# Patient Record
Sex: Male | Born: 1990 | Race: Black or African American | Hispanic: No | Marital: Single | Smoking: Current some day smoker
Health system: Southern US, Community
[De-identification: ages and names within clinical notes are randomized; demographics above are authoritative.]

## PROBLEM LIST (undated history)

## (undated) DIAGNOSIS — S62143A Displaced fracture of body of hamate [unciform] bone, unspecified wrist, initial encounter for closed fracture: Secondary | ICD-10-CM

## (undated) HISTORY — PX: NO PAST SURGERIES: SHX2092

---
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
## 2012-01-07 ENCOUNTER — Encounter (HOSPITAL_COMMUNITY): Payer: Self-pay | Admitting: Emergency Medicine

## 2012-01-07 ENCOUNTER — Emergency Department (INDEPENDENT_AMBULATORY_CARE_PROVIDER_SITE_OTHER)
Admission: EM | Admit: 2012-01-07 | Discharge: 2012-01-07 | Disposition: A | Discharge status: Home / Self Care | Payer: Self-pay | Source: Home / Self Care

## 2012-01-07 DIAGNOSIS — L259 Unspecified contact dermatitis, unspecified cause: Secondary | ICD-10-CM

## 2012-01-07 NOTE — ED Provider Notes (Signed)
Medical screening examination/treatment/procedure(s) were performed by non-physician practitioner and as supervising physician I was immediately available for consultation/collaboration.  Leslee Home, M.D.   Reuben Likes, MD 01/07/12 2056

## 2012-01-07 NOTE — ED Notes (Signed)
Has noticed skin peeling on both hands for 2 weeks, no pain, no itching, denies blisters/bumps.  Patient reports he noticed skin pealing and skin color is different.

## 2012-01-07 NOTE — ED Provider Notes (Signed)
History     CSN: [id number]  Arrival date & time 01/07/12  1310   None     Chief Complaint  Patient presents with  . Rash    (Consider location/radiation/quality/duration/timing/severity/associated sxs/prior treatment) HPI Comments: Pt used degreaser on wheels of car 3 weeks ago without gloves.  No other chemical exposure, contact with new or different substances/meds.  A week later, skin of hands began to slough off.   Patient is a 21 y.o. male presenting with rash. The history is provided by the patient.  Rash  This is a new problem. Episode onset: 2 weeks ago. The problem has been gradually improving. The problem is associated with chemical exposure. There has been no fever. The rash is present on the left hand and right hand. The pain is at a severity of 0/10. Pertinent negatives include no itching. He has tried nothing for the symptoms.    History reviewed. No pertinent past medical history.  History reviewed. No pertinent past surgical history.  History reviewed. No pertinent family history.  History  Substance Use Topics  . Smoking status: Never Smoker   . Smokeless tobacco: Not on file  . Alcohol Use: No      Review of Systems  Constitutional: Negative for fever and chills.  Musculoskeletal:       Denies hand or wrist pain or change in function  Skin: Positive for color change and rash. Negative for itching and wound.  Neurological: Negative for weakness and numbness.    Allergies  Review of patient's allergies indicates no known allergies.  Home Medications  No current outpatient prescriptions on file.  BP 132/82  Pulse 85  Temp 98.7 F (37.1 C) (Oral)  Resp 14  SpO2 100%  Physical Exam  Constitutional: He appears well-developed and well-nourished. No distress.  Musculoskeletal:       Right hand: Normal.       Left hand: Normal.  Skin: Skin is warm and dry.       B hands with skin sloughing, new paler skin appears normal, nontender, healthy.       ED Course  Procedures (including critical care time)  Labs Reviewed - No data to display No results found.   1. Contact dermatitis       MDM          Cathlyn Parsons, NP 01/07/12 2054

## 2012-01-07 NOTE — Discharge Instructions (Signed)
Use vaseline or a good lotion/moisturizer cream on your hands until the skin heals.  Avoid contact with chemicals or putting your hands in water for prolonged periods (like washing dishes).   Contact Dermatitis Contact dermatitis is a reaction to certain substances that touch the skin. Contact dermatitis can be either irritant contact dermatitis or allergic contact dermatitis. Irritant contact dermatitis does not require previous exposure to the substance for a reaction to occur.Allergic contact dermatitis only occurs if you have been exposed to the substance before. Upon a repeat exposure, your body reacts to the substance.  CAUSES  Many substances can cause contact dermatitis. Irritant dermatitis is most commonly caused by repeated exposure to mildly irritating substances, such as:  Makeup.   Soaps.   Detergents.   Bleaches.   Acids.   Metal salts, such as nickel.  Allergic contact dermatitis is most commonly caused by exposure to:  Poisonous plants.   Chemicals (deodorants, shampoos).   Jewelry.   Latex.   Neomycin in triple antibiotic cream.   Preservatives in products, including clothing.  SYMPTOMS  The area of skin that is exposed may develop:  Dryness or flaking.   Redness.   Cracks.   Itching.   Pain or a burning sensation.   Blisters.  With allergic contact dermatitis, there may also be swelling in areas such as the eyelids, mouth, or genitals.  DIAGNOSIS  Your caregiver can usually tell what the problem is by doing a physical exam. In cases where the cause is uncertain and an allergic contact dermatitis is suspected, a patch skin test may be performed to help determine the cause of your dermatitis. TREATMENT Treatment includes protecting the skin from further contact with the irritating substance by avoiding that substance if possible. Barrier creams, powders, and gloves may be helpful. Your caregiver may also recommend:  Steroid creams or ointments  applied 2 times daily. For best results, soak the rash area in cool water for 20 minutes. Then apply the medicine. Cover the area with a plastic wrap. You can store the steroid cream in the refrigerator for a "chilly" effect on your rash. That may decrease itching. Oral steroid medicines may be needed in more severe cases.   Antibiotics or antibacterial ointments if a skin infection is present.   Antihistamine lotion or an antihistamine taken by mouth to ease itching.   Lubricants to keep moisture in your skin.   Burow's solution to reduce redness and soreness or to dry a weeping rash. Mix one packet or tablet of solution in 2 cups cool water. Dip a clean washcloth in the mixture, wring it out a bit, and put it on the affected area. Leave the cloth in place for 30 minutes. Do this as often as possible throughout the day.   Taking several cornstarch or baking soda baths daily if the area is too large to cover with a washcloth.  Harsh chemicals, such as alkalis or acids, can cause skin damage that is like a burn. You should flush your skin for 15 to 20 minutes with cold water after such an exposure. You should also seek immediate medical care after exposure. Bandages (dressings), antibiotics, and pain medicine may be needed for severely irritated skin.  HOME CARE INSTRUCTIONS  Avoid the substance that caused your reaction.   Keep the area of skin that is affected away from hot water, soap, sunlight, chemicals, acidic substances, or anything else that would irritate your skin.   Do not scratch the rash. Scratching may  cause the rash to become infected.   You may take cool baths to help stop the itching.   Only take over-the-counter or prescription medicines as directed by your caregiver.   See your caregiver for follow-up care as directed to make sure your skin is healing properly.  SEEK MEDICAL CARE IF:   Your condition is not better after 3 days of treatment.   You seem to be getting  worse.   You see signs of infection such as swelling, tenderness, redness, soreness, or warmth in the affected area.   You have any problems related to your medicines.  Document Released: 06/23/2000 Document Revised: 06/15/2011 Document Reviewed: 11/29/2010 [facility] Patient Information 2012 [place], [place].

## 2014-04-29 ENCOUNTER — Encounter (HOSPITAL_BASED_OUTPATIENT_CLINIC_OR_DEPARTMENT_OTHER): Payer: Self-pay | Admitting: Emergency Medicine

## 2014-04-29 ENCOUNTER — Emergency Department (HOSPITAL_BASED_OUTPATIENT_CLINIC_OR_DEPARTMENT_OTHER)
Admission: EM | Admit: 2014-04-29 | Discharge: 2014-04-29 | Disposition: A | Discharge status: Home / Self Care | Payer: Self-pay | Attending: Emergency Medicine | Admitting: Emergency Medicine

## 2014-04-29 DIAGNOSIS — Z720 Tobacco use: Secondary | ICD-10-CM | POA: Insufficient documentation

## 2014-04-29 DIAGNOSIS — B349 Viral infection, unspecified: Secondary | ICD-10-CM | POA: Insufficient documentation

## 2014-04-29 DIAGNOSIS — M791 Myalgia, unspecified site: Secondary | ICD-10-CM

## 2014-04-29 LAB — RAPID STREP SCREEN (MED CTR MEBANE ONLY): Streptococcus, Group A Screen (Direct): NEGATIVE

## 2014-04-29 MED ORDER — KETOROLAC TROMETHAMINE 60 MG/2ML IM SOLN
60.0000 mg | Freq: Once | INTRAMUSCULAR | Status: AC
  Administered 2014-04-29: 60 mg via INTRAMUSCULAR
  Filled 2014-04-29: qty 2

## 2014-04-29 MED ORDER — IBUPROFEN 600 MG PO TABS: 600.0000 mg | ORAL_TABLET | Freq: Four times a day (QID) | ORAL | Status: DC | PRN

## 2014-04-29 NOTE — ED Provider Notes (Signed)
CSN: [id number]     Arrival date & time 04/29/14  0844 History   First MD Initiated Contact with Patient 04/29/14 [phone]     Chief Complaint  Patient presents with  . Cough     (Consider location/radiation/quality/duration/timing/severity/associated sxs/prior Treatment) Patient is a 23 y.o. male presenting with cough.  Cough Associated symptoms: chills, headaches and sore throat   Associated symptoms: no chest pain, no fever, no rash and no shortness of breath     This is a 23 year old male who presents with myalgias, cough, chills, and sore throat. Symptoms started 2 days ago. Has had sick contacts. Has been using over-the-counter medications at home without relief. He reports a nonproductive cough that is intermittent and not associated with chest pain or shortness of breath. He reports diffuse bodyaches and chills that are worse at night. He also reports sore throat. He denies any difficulty swallowing.  He is a current smoker.  History reviewed. No pertinent past medical history. History reviewed. No pertinent past surgical history. No family history on file. History  Substance Use Topics  . Smoking status: Current Some Day Smoker -- 0.50 packs/day for 3 years    Types: Cigarettes  . Smokeless tobacco: Never Used  . Alcohol Use: Yes     Comment: Pt stated that he drinks beer on occasion    Review of Systems  Constitutional: Positive for chills. Negative for fever.  HENT: Positive for sore throat.   Respiratory: Positive for cough. Negative for chest tightness and shortness of breath.   Cardiovascular: Negative.  Negative for chest pain.  Gastrointestinal: Negative.  Negative for nausea, vomiting, abdominal pain and diarrhea.  Genitourinary: Negative.  Negative for dysuria.  Musculoskeletal: Negative for back pain.  Skin: Negative for rash.  Neurological: Positive for headaches.  All other systems reviewed and are negative.     Allergies  Review of patient's allergies  indicates no known allergies.  Home Medications   Prior to Admission medications   Medication Sig Start Date End Date Taking? Authorizing Provider  ibuprofen (ADVIL,MOTRIN) 100 MG tablet Take 100 mg by mouth every 6 (six) hours as needed for fever.   Yes Historical Provider, MD  ibuprofen (ADVIL,MOTRIN) 600 MG tablet Take 1 tablet (600 mg total) by mouth every 6 (six) hours as needed. 04/29/14   Shon Batonourtney F Willem Klingensmith, MD   BP 125/64  Pulse 84  Temp(Src) 98.8 F (37.1 C) (Oral)  Resp 16  Ht 6' (1.829 m)  Wt 130 lb (58.968 kg)  BMI 17.63 kg/m2  SpO2 98% Physical Exam  Nursing note and vitals reviewed. Constitutional: He is oriented to person, place, and time. He appears well-developed and well-nourished. No distress.  HENT:  Head: Normocephalic and atraumatic.  Mouth/Throat: Oropharynx is clear and moist. No oropharyngeal exudate.  Mild erythema to the posterior oropharynx, uvula midline, no significant tonsillar swelling  Eyes: EOM are normal. Pupils are equal, round, and reactive to light.  Neck: Neck supple.  No evidence of meningismus  Cardiovascular: Normal rate, regular rhythm and normal heart sounds.   No murmur heard. Pulmonary/Chest: Effort normal and breath sounds normal. No respiratory distress. He has no wheezes.  Abdominal: Soft. Bowel sounds are normal. There is no tenderness. There is no rebound.  Musculoskeletal: He exhibits no edema.  Lymphadenopathy:    He has no cervical adenopathy.  Neurological: He is alert and oriented to person, place, and time.  Skin: Skin is warm and dry.  Psychiatric: He has a normal mood and affect.  ED Course  Procedures (including critical care time) Labs Review Labs Reviewed  RAPID STREP SCREEN    Imaging Review No results found.   EKG Interpretation None     Medications  ketorolac (TORADOL) injection 60 mg (60 mg Intramuscular Given 04/29/14 0921)    MDM   Final diagnoses:  Viral syndrome  Myalgia   Patient  presents with multiple complaints. Is nontoxic-appearing and exam is reassuring. Suspect viral syndrome. Strep screen sent and negative. No active wheezing on exam and lung sounds are clear. No indication at this time for x-ray. Discuss with patient supportive care including hydration, Tylenol, ibuprofen, and over-the-counter medications for symptom relief. Patient stated understanding.  After history, exam, and medical workup I feel the patient has been appropriately medically screened and is safe for discharge home. Pertinent diagnoses were discussed with the patient. Patient was given return precautions.      Shon Batonourtney F Andreal Vultaggio, MD 04/29/14 [phone]

## 2014-04-29 NOTE — Discharge Instructions (Signed)

## 2014-04-29 NOTE — ED Notes (Signed)
MD and this RN at the bedside 

## 2014-04-29 NOTE — ED Notes (Addendum)
Pt stated that he started coughing up Normon Pettijohn mucuous on Monday 2 days ago around 1800 and stated that he has had a headache and sore throat since then. Pt states that he has chest pain only when coughing. Pt stated that he has been weak and had bodyaches and chills since the onset of symptoms. Pt stated that he has taken ibuprofen and nyquil without relief of the cough and headache.

## 2014-05-01 LAB — CULTURE, GROUP A STREP

## 2018-08-20 ENCOUNTER — Encounter (HOSPITAL_COMMUNITY): Payer: Self-pay | Admitting: *Deleted

## 2018-08-20 ENCOUNTER — Other Ambulatory Visit: Payer: Self-pay

## 2018-08-20 ENCOUNTER — Emergency Department (HOSPITAL_COMMUNITY)
Admission: EM | Admit: 2018-08-20 | Discharge: 2018-08-20 | Disposition: A | Discharge status: Home / Self Care | Payer: Self-pay | Attending: Emergency Medicine | Admitting: Emergency Medicine

## 2018-08-20 DIAGNOSIS — F17210 Nicotine dependence, cigarettes, uncomplicated: Secondary | ICD-10-CM | POA: Insufficient documentation

## 2018-08-20 DIAGNOSIS — L0291 Cutaneous abscess, unspecified: Secondary | ICD-10-CM

## 2018-08-20 DIAGNOSIS — L0231 Cutaneous abscess of buttock: Secondary | ICD-10-CM | POA: Insufficient documentation

## 2018-08-20 MED ORDER — OXYCODONE-ACETAMINOPHEN 5-325 MG PO TABS: 1.0000 | ORAL_TABLET | Freq: Four times a day (QID) | ORAL | 0 refills | Status: DC | PRN

## 2018-08-20 MED ORDER — OXYCODONE-ACETAMINOPHEN 5-325 MG PO TABS
1.0000 | ORAL_TABLET | Freq: Once | ORAL | Status: AC
Start: 2018-08-20 — End: 2018-08-20
  Administered 2018-08-20: 1 via ORAL
  Filled 2018-08-20: qty 1

## 2018-08-20 MED ORDER — LIDOCAINE HCL 2 % IJ SOLN
20.0000 mL | Freq: Once | INTRAMUSCULAR | Status: AC
  Administered 2018-08-20: 400 mg
  Filled 2018-08-20: qty 20

## 2018-08-20 MED ORDER — SULFAMETHOXAZOLE-TRIMETHOPRIM 800-160 MG PO TABS: 1.0000 | ORAL_TABLET | Freq: Two times a day (BID) | ORAL | 0 refills | Status: AC

## 2018-08-20 NOTE — Discharge Instructions (Addendum)
Please read attached information. If you experience any new or worsening signs or symptoms please return to the emergency room for evaluation. Please follow-up with your primary care provider or specialist as discussed. Please use medication prescribed only as directed and discontinue taking if you have any concerning signs or symptoms.   °

## 2018-08-20 NOTE — ED Provider Notes (Signed)
[facility] EMERGENCY DEPARTMENT Provider Note   CSN: [id number] Arrival date & time: 08/20/18  3354   History   Chief Complaint No chief complaint on file.   HPI Nathaniel Jordan is a 28 y.o. male.  HPI    27 year old male presents today with complaints of boil.  Patient notes approximately 1.5 weeks ago he noticed a boil developing on his gluteus.  He notes this is slowly worsened.  Patient denies any fever, denies any history of the same.  Patient denies any pain with defecation, bloody or purulent stool.    History reviewed. No pertinent past medical history.  There are no active problems to display for this patient.   History reviewed. No pertinent surgical history.      Home Medications    Prior to Admission medications   Medication Sig Start Date End Date Taking? Authorizing Provider  ibuprofen (ADVIL,MOTRIN) 100 MG tablet Take 100 mg by mouth every 6 (six) hours as needed for fever.    [provider]  ibuprofen (ADVIL,MOTRIN) 600 MG tablet Take 1 tablet (600 mg total) by mouth every 6 (six) hours as needed. 04/29/14   Horton, Mayer Masker, MD  oxyCODONE-acetaminophen (PERCOCET/ROXICET) 5-325 MG tablet Take 1 tablet by mouth every 6 (six) hours as needed. 08/20/18   Kidada Ging, Tinnie Gens, PA-C  sulfamethoxazole-trimethoprim (BACTRIM DS,SEPTRA DS) 800-160 MG tablet Take 1 tablet by mouth 2 (two) times daily for 7 days. 08/20/18 08/27/18  Eyvonne Mechanic, PA-C    Family History No family history on file.  Social History Social History   Tobacco Use  . Smoking status: Current Some Day Smoker    Packs/day: 0.50    Years: 3.00    Pack years: 1.50    Types: Cigarettes  . Smokeless tobacco: Never Used  Substance Use Topics  . Alcohol use: Yes    Comment: Pt stated that he drinks beer on occasion  . Drug use: Yes    Types: Marijuana     Allergies   Patient has no known allergies.   Review of Systems Review of Systems  All other  systems reviewed and are negative.    Physical Exam Updated Vital Signs BP 112/66 (BP Location: Right Arm)   Pulse (!) 102   Temp 98.5 F (36.9 C) (Oral)   Resp 14   Ht 5\' 11"  (1.803 m)   Wt 63.5 kg   SpO2 99%   BMI 19.53 kg/m   Physical Exam Vitals signs and nursing note reviewed. Exam conducted with a chaperone present.  Constitutional:      Appearance: He is well-developed.  HENT:     Head: Normocephalic and atraumatic.  Eyes:     General: No scleral icterus.       Right eye: No discharge.        Left eye: No discharge.     Conjunctiva/sclera: Conjunctivae normal.     Pupils: Pupils are equal, round, and reactive to light.  Neck:     Musculoskeletal: Normal range of motion.     Vascular: No JVD.     Trachea: No tracheal deviation.  Pulmonary:     Effort: Pulmonary effort is normal.     Breath sounds: No stridor.  Genitourinary:      Comments: 2 cm area of induration with central fluctuance noted in the photo below-induration does not extend into the anus, internal rectal exam shows no fluctuance induration or tenderness Neurological:     Mental Status: He is alert and  oriented to person, place, and time.     Coordination: Coordination normal.  Psychiatric:        Behavior: Behavior normal.        Thought Content: Thought content normal.        Judgment: Judgment normal.      ED Treatments / Results  Labs (all labs ordered are listed, but only abnormal results are displayed) Labs Reviewed  AEROBIC/ANAEROBIC CULTURE (SURGICAL/DEEP WOUND)    EKG None  Radiology No results found.  Procedures .Marland Kitchen.Incision and Drainage Date/Time: 08/20/2018 10:15 AM Performed by: Eyvonne MechanicHedges, Karene Bracken, PA-C Authorized by: Eyvonne MechanicHedges, Khadim Lundberg, PA-C   Consent:    Consent obtained:  Verbal   Consent given by:  Patient   Risks discussed:  Bleeding, incomplete drainage, damage to other organs, pain and infection   Alternatives discussed:  No treatment, delayed treatment and  alternative treatment Location:    Type:  Abscess   Size:  2   Location: left buttock  Pre-procedure details:    Skin preparation:  Betadine Anesthesia (see MAR for exact dosages):    Anesthesia method:  Local infiltration   Local anesthetic:  Lidocaine 2% w/o epi Procedure type:    Complexity:  Simple Procedure details:    Needle aspiration: no     Incision types:  Single straight   Incision depth:  Dermal   Scalpel blade:  11   Wound management:  Probed and deloculated   Drainage:  Purulent   Drainage amount:  Moderate   Wound treatment:  Wound left open   Packing materials:  1/4 in gauze Post-procedure details:    Patient tolerance of procedure:  Tolerated well, no immediate complications   (including critical care time)  Medications Ordered in ED Medications  lidocaine (XYLOCAINE) 2 % (with pres) injection 400 mg (400 mg Infiltration Given by Other 08/20/18 0904)  oxyCODONE-acetaminophen (PERCOCET/ROXICET) 5-325 MG per tablet 1 tablet (1 tablet Oral Given 08/20/18 0807)     Initial Impression / Assessment and Plan / ED Course  I have reviewed the triage vital signs and the nursing notes.  Pertinent labs & imaging results that were available during my care of the patient were reviewed by me and considered in my medical decision making (see chart for details).     Labs:   Imaging:  Consults:  Therapeutics: Percocet, lidocaine  Discharge Meds: Bactrim, Percocet  Assessment/Plan: 28 year old male presents today with a gluteal abscess.  This does not extend into the rectum or anus.  The abscess was drained and packed without complication.  Patient placed on antibiotics.  He will follow-up in 2 days if symptoms are not improving and return immediately if they worsen.  He verbalized understanding and agreement to today's plan had no further questions or concerns at the time of discharge.   Final Clinical Impressions(s) / ED Diagnoses   Final diagnoses:  Abscess     ED Discharge Orders         Ordered    sulfamethoxazole-trimethoprim (BACTRIM DS,SEPTRA DS) 800-160 MG tablet  2 times daily     08/20/18 0858    oxyCODONE-acetaminophen (PERCOCET/ROXICET) 5-325 MG tablet  Every 6 hours PRN     08/20/18 0858           Eyvonne MechanicHedges, Davey Bergsma, PA-C 08/20/18 1016    Gwyneth SproutPlunkett, [place], MD 08/20/18 1558

## 2018-08-20 NOTE — ED Triage Notes (Signed)
Pt in c/o L buttocks boil onset x 1.5 wk, pt afebrile, A&O x4

## 2018-08-24 LAB — AEROBIC/ANAEROBIC CULTURE W GRAM STAIN (SURGICAL/DEEP WOUND)

## 2018-08-24 LAB — AEROBIC/ANAEROBIC CULTURE (SURGICAL/DEEP WOUND)

## 2018-08-25 ENCOUNTER — Telehealth: Payer: Self-pay | Admitting: Emergency Medicine

## 2018-08-25 NOTE — Telephone Encounter (Signed)
Post ED Visit - Positive Culture Follow-up: Successful Patient Follow-Up  Culture assessed and recommendations reviewed by:  []  Enzo Bi, Pharm.D. []  Celedonio Miyamoto, Pharm.D., BCPS AQ-ID []  Garvin Fila, Pharm.D., BCPS []  Georgina Pillion, Pharm.D., BCPS []  [place], [place].D., BCPS, AAHIVP []  Estella Husk, Pharm.D., BCPS, AAHIVP []  Lysle Pearl, PharmD, BCPS []  Phillips Climes, PharmD, BCPS []  Agapito Games, PharmD, BCPS []  Verlan Friends, PharmD Link Snuffer PharmD  Positive culture  []  Patient discharged without antimicrobial prescription and treatment is now indicated []  Organism is resistant to prescribed ED discharge antimicrobial []  Patient with positive blood cultures  Changes discussed with ED provider: symptom check, if symptomatic start Augmentin 875mg  po bid x 7 days  Attempting to contact patient   Berle Mull 08/25/2018, 2:26 PM

## 2018-08-25 NOTE — Progress Notes (Signed)
ED Antimicrobial Stewardship Positive Culture Follow Up   Nathaniel Jordan is an 28 y.o. male who presented to [facility] on 08/20/2018 with a chief complaint of No chief complaint on file.   Recent Results (from the past 720 hour(s))  Aerobic/Anaerobic Culture (surgical/deep wound)     Status: None   Collection Time: 08/20/18  9:11 AM  Result Value Ref Range Status   Specimen Description BUTTOCKS  Final   Special Requests ABSCESS  Final   Gram Stain   Final    FEW WBC PRESENT, PREDOMINANTLY PMN MODERATE GRAM POSITIVE COCCI FEW GRAM POSITIVE RODS FEW GRAM NEGATIVE RODS    Culture   Final    FEW ACTINOMYCES SPECIES FEW BIFIDOBACTERIUM SPECIES BETA LACTAMASE NEGATIVE Performed at [facility], [address] [place], [place] [address]    Report Status 08/24/2018 FINAL  Final    [x]  Treated with Bactrim, organism resistant to prescribed antimicrobial   New antibiotic prescription: PA-C requests symptom check and if packing has been removed. If patient remains symptomatic, treat with Augmentin 875mg  po BID for 7 days. If non-symptomatic after draining and packing, then no further treatment required.   ED Provider: Army Melia, PA-C   Fayne Norrie 08/25/2018, 10:46 AM Clinical Pharmacist Monday - Friday phone -  [phone] Saturday - Sunday phone - [phone]

## 2018-08-29 ENCOUNTER — Telehealth: Payer: Self-pay

## 2018-08-29 NOTE — Telephone Encounter (Signed)
Called for symptom check on abscess. States everything is fine no problems. No abx change  Per Army Melia [facility]

## 2021-03-14 ENCOUNTER — Emergency Department (HOSPITAL_BASED_OUTPATIENT_CLINIC_OR_DEPARTMENT_OTHER)
Admission: EM | Admit: 2021-03-14 | Discharge: 2021-03-14 | Disposition: A | Discharge status: Home / Self Care | Payer: 59 | Attending: Emergency Medicine | Admitting: Emergency Medicine

## 2021-03-14 ENCOUNTER — Emergency Department (HOSPITAL_BASED_OUTPATIENT_CLINIC_OR_DEPARTMENT_OTHER): Payer: 59

## 2021-03-14 ENCOUNTER — Other Ambulatory Visit: Payer: Self-pay

## 2021-03-14 ENCOUNTER — Other Ambulatory Visit (HOSPITAL_BASED_OUTPATIENT_CLINIC_OR_DEPARTMENT_OTHER): Payer: Self-pay | Admitting: Orthopaedic Surgery

## 2021-03-14 ENCOUNTER — Encounter (HOSPITAL_BASED_OUTPATIENT_CLINIC_OR_DEPARTMENT_OTHER): Payer: Self-pay

## 2021-03-14 DIAGNOSIS — S62144A Nondisplaced fracture of body of hamate [unciform] bone, right wrist, initial encounter for closed fracture: Secondary | ICD-10-CM | POA: Diagnosis not present

## 2021-03-14 DIAGNOSIS — F17210 Nicotine dependence, cigarettes, uncomplicated: Secondary | ICD-10-CM | POA: Diagnosis not present

## 2021-03-14 DIAGNOSIS — W228XXA Striking against or struck by other objects, initial encounter: Secondary | ICD-10-CM | POA: Insufficient documentation

## 2021-03-14 DIAGNOSIS — M79641 Pain in right hand: Secondary | ICD-10-CM | POA: Diagnosis not present

## 2021-03-14 DIAGNOSIS — S6991XA Unspecified injury of right wrist, hand and finger(s), initial encounter: Secondary | ICD-10-CM | POA: Diagnosis present

## 2021-03-14 NOTE — ED Triage Notes (Signed)
Pt arrives ambulatory to ED with c/o pain to right hand after punching a wall last night.

## 2021-03-14 NOTE — ED Provider Notes (Signed)
[facility] EMERGENCY DEPARTMENT Provider Note   CSN: [id number] Arrival date & time: 03/14/21  6063     History Chief Complaint  Patient presents with   Hand Injury    Nathaniel Jordan is a 30 y.o. male with no significant past medical history who reports hand pain after punching a wall last night with his right hand. Patient reports he felt some pain after the punch but it wasn't hurting too bad initially. When he woke up this morning, he had a lot of pain and had difficulty opening and closing fingers 2/2 pain. Patient has not taken any pain medication.   Hand Injury     History reviewed. No pertinent past medical history.  There are no problems to display for this patient.   History reviewed. No pertinent surgical history.     No family history on file.  Social History   Tobacco Use   Smoking status: Some Days    Packs/day: 0.50    Years: 3.00    Pack years: 1.50    Types: Cigarettes   Smokeless tobacco: Never  Vaping Use   Vaping Use: Every day  Substance Use Topics   Alcohol use: Yes    Comment: Pt stated that he drinks beer on occasion   Drug use: Not Currently    Types: Marijuana    Home Medications Prior to Admission medications   Medication Sig Start Date End Date Taking? Authorizing Provider  ibuprofen (ADVIL,MOTRIN) 100 MG tablet Take 100 mg by mouth every 6 (six) hours as needed for fever.    [provider]  ibuprofen (ADVIL,MOTRIN) 600 MG tablet Take 1 tablet (600 mg total) by mouth every 6 (six) hours as needed. 04/29/14   Horton, Mayer Masker, MD  oxyCODONE-acetaminophen (PERCOCET/ROXICET) 5-325 MG tablet Take 1 tablet by mouth every 6 (six) hours as needed. 08/20/18   Eyvonne Mechanic, PA-C    Allergies    Patient has no known allergies.  Review of Systems   Review of Systems  Musculoskeletal:        Hand pain  All other systems reviewed and are negative.  Physical Exam Updated Vital Signs BP 127/84 (BP Location:  Left Arm)   Pulse 88   Temp 98.7 F (37.1 C) (Oral)   Resp 16   Ht 5\' 11"  (1.803 m)   Wt 63.5 kg   SpO2 99%   BMI 19.53 kg/m   Physical Exam Vitals and nursing note reviewed.  Constitutional:      General: He is not in acute distress.    Appearance: Normal appearance.  HENT:     Head: Normocephalic and atraumatic.  Eyes:     General:        Right eye: No discharge.        Left eye: No discharge.  Cardiovascular:     Rate and Rhythm: Normal rate and regular rhythm.     Comments: Radial and ulnar pulses of right hand 2+ Pulmonary:     Effort: Pulmonary effort is normal. No respiratory distress.  Musculoskeletal:        General: No deformity.     Comments: No obvious deformity of the right hand. TTP on the dorsal aspect of the 5th metacarpal. No TTP on radial or ulnar styloid. No TTP in any of the phalanges. No anatomic snuffbox tenderness. Patient is able to open and close hand, oppose pinky to thumb with some pain. Strength diminished to 3/5 2/2 pain.  Skin:  General: Skin is warm and dry.     Capillary Refill: Capillary refill takes less than 2 seconds. Intact distal to injury Neurological:     Mental Status: He is alert and oriented to person, place, and time.     Comments: No sensory deficit of right hand or wrist.  Psychiatric:        Mood and Affect: Mood normal.        Behavior: Behavior normal.    ED Results / Procedures / Treatments   Labs (all labs ordered are listed, but only abnormal results are displayed) Labs Reviewed - No data to display  EKG None  Radiology DG Hand Complete Right  Result Date: 03/14/2021 CLINICAL DATA:  RIGHT hand pain after punching a wall last night EXAM: RIGHT HAND - COMPLETE 3+ VIEW COMPARISON:  None. FINDINGS: Osseous mineralization normal. Congenital lunatotriquetral fusion. Remaining joint spaces preserved. Longitudinal displaced fracture of the hamate, minimally distracted, extending into the fifth CMC joint and  triquetrohamate joint. No additional fracture, dislocation, or bone destruction. IMPRESSION: Longitudinal mildly displaced intra-articular hamate fracture. Electronically Signed   By: Ulyses Southward M.D.   On: 03/14/2021 10:14    Procedures Procedures   Medications Ordered in ED Medications - No data to display  ED Course  I have reviewed the triage vital signs and the nursing notes.  Pertinent labs & imaging results that were available during my care of the patient were reviewed by me and considered in my medical decision making (see chart for details).    MDM Rules/Calculators/A&P                         Radiograph of hand shows "longitudinal mildly displaced intra-articular hamate fracture".  Consulted hand surgery to discuss appropriate splinting, and follow-up.  Patient is neurovascularly intact, good capillary refill distal to site of injury, no rigidity of skin, warmth, excessive tenderness, do not favor any compartment syndrome.  Hand surgery suggests volar wrist splint, CT of hand, and follow up with Dr. Frazier Butt at patient's earliest convenience. Patient informed of plan. Return precautions given. Final Clinical Impression(s) / ED Diagnoses Final diagnoses:  Closed nondisplaced fracture of hamate bone of right wrist, unspecified portion of hamate, initial encounter    Rx / DC Orders ED Discharge Orders     None        Olene Floss, PA-C 03/14/21 1257    Virgina Norfolk, DO 03/14/21 1544

## 2021-03-14 NOTE — Discharge Instructions (Addendum)
As we discussed you have a hamate fracture in your right hand, which is one of the carpal bones and the base of your palm.  Please follow-up with the orthopedic doctor's contact information I have attached your chart today.  Do not get your splint wet you may shower with a trash bag over top of it.  Please use Tylenol or ibuprofen for pain.  You may use 600 mg ibuprofen every 6 hours or 1000 mg of Tylenol every 6 hours.  You may choose to alternate between the 2.  This would be most effective.  Not to exceed 4 g of Tylenol within 24 hours.  Not to exceed 3200 mg ibuprofen 24 hours.

## 2021-03-17 ENCOUNTER — Encounter: Payer: Self-pay | Admitting: Orthopedic Surgery

## 2021-03-17 ENCOUNTER — Other Ambulatory Visit: Payer: Self-pay

## 2021-03-17 ENCOUNTER — Ambulatory Visit (INDEPENDENT_AMBULATORY_CARE_PROVIDER_SITE_OTHER): Payer: 59 | Admitting: Orthopedic Surgery

## 2021-03-17 DIAGNOSIS — S62141A Displaced fracture of body of hamate [unciform] bone, right wrist, initial encounter for closed fracture: Secondary | ICD-10-CM | POA: Diagnosis not present

## 2021-03-17 NOTE — H&P (View-Only) (Signed)
Office Visit Note   Patient: Nathaniel Jordan           Date of Birth: 25-Jun-1991           MRN: [id number] Visit Date: 03/17/2021              Requested by: No referring provider defined for this encounter. PCP: Patient, No Pcp Per (Inactive)   Assessment & Plan: Visit Diagnoses:  1. Closed displaced fracture of body of hamate of right wrist, initial encounter     Plan: We discussed the diagnosis, prognosis, and both conservative and operative treatment options for his displaced right hamate body fracture.  After our discussion, the patient has elected to proceed with ORIF hamate with pinning of 4th and 5th CMC joints.  We reviewed the benefits of surgery and the potential risks including, but not limited to, persistent symptoms, infection, damage to nearby nerves and blood vessels, delayed wound healing, nonunion, malunion.    All patient concerns and questions were addressed.  A surgical date will be confirmed with the patient.    Follow-Up Instructions: No follow-ups on file.   Orders:  No orders of the defined types were placed in this encounter.  No orders of the defined types were placed in this encounter.     Procedures: No procedures performed   Clinical Data: No additional findings.   Subjective: Chief Complaint  Patient presents with   Right Hand - Fracture    DOI 03/13/2021    This is a 30 year old right-hand-dominant male who presents as an ER referral with dorsal ulnar right hand pain.  He punched a wall on Monday and was seen in the ER.  A CT was performed which showed a displaced fracture of the hamate body with some subluxation of the fourth and fifth CMC joints.  He was placed into a splint.  He continues to have pain with range of motion of his fingers.  His pain is 5 or 6 out of 10 and localized to the dorsal ulnar aspect of the hand around the hamate.  He is never injured this hand before.  He denies any numbness or paresthesias.  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   Review of Systems  Constitutional: Negative.   Respiratory: Negative.    Cardiovascular: Negative.   Musculoskeletal:  Positive for joint swelling.  Skin: Negative.   Neurological: Negative.   Psychiatric/Behavioral: Negative.      Objective: Vital Signs: Ht 5\' 11"  (1.803 m)   Wt 150 lb (68 kg)   BMI 20.92 kg/m   Physical Exam Constitutional:      Appearance: Normal appearance.  Cardiovascular:     Rate and Rhythm: Normal rate.     Pulses: Normal pulses.  Pulmonary:     Effort: Pulmonary effort is normal.  Skin:    General: Skin is warm and dry.     Capillary Refill: Capillary refill takes less than 2 seconds.  Neurological:     General: No focal deficit present.     Mental Status: He is alert.  Psychiatric:        Mood and Affect: Mood normal.    Right Hand Exam   Tenderness  Right hand tenderness location: TTP at dorsal aspect of hand over hamate, moderate swelling of the dorsum of his hand.  No wounds.  Range of Motion  Wrist  Right wrist extension: Limited by pain, swelling.  Right wrist flexion: Limited  by pain, swelling.   Muscle Strength  Right wrist normal muscle strength: Limited by pain, swelling.  Other  Erythema: absent Sensation: normal Pulse: present     Specialty Comments:  No specialty comments available.  Imaging: X-ray of the right hand taken in the ER on Monday reviewed interpreted by me.  Imaging demonstrates a difficult to characterize fracture of the hamate body.  The fifth CMC joint appears to be subluxed in these images.  CT scan of the right hand from his ER visit on Monday also reviewed by me.  The study demonstrates a coronal fracture through the hamate body with dorsal displacement of the dorsal fragment and mild subluxation of the fourth and fifth CMC joints.  There is a comminuted fracture involving the volar articular surface of the fourth  metacarpal.   PMFS History: Patient Active Problem List   Diagnosis Date Noted   Closed displaced fracture of body of right hamate bone 03/17/2021   No past medical history on file.  No family history on file.  No past surgical history on file. Social History   Occupational History   Not on file  Tobacco Use   Smoking status: Some Days    Packs/day: 0.50    Years: 3.00    Pack years: 1.50    Types: Cigarettes   Smokeless tobacco: Never  Vaping Use   Vaping Use: Every day  Substance and Sexual Activity   Alcohol use: Yes    Comment: Pt stated that he drinks beer on occasion   Drug use: Not Currently    Types: Marijuana   Sexual activity: Yes        

## 2021-03-17 NOTE — Progress Notes (Signed)
Office Visit Note   Patient: Nathaniel Jordan           Date of Birth: 25-Jun-1991           MRN: [id number] Visit Date: 03/17/2021              Requested by: No referring provider defined for this encounter. PCP: Patient, No Pcp Per (Inactive)   Assessment & Plan: Visit Diagnoses:  1. Closed displaced fracture of body of hamate of right wrist, initial encounter     Plan: We discussed the diagnosis, prognosis, and both conservative and operative treatment options for his displaced right hamate body fracture.  After our discussion, the patient has elected to proceed with ORIF hamate with pinning of 4th and 5th CMC joints.  We reviewed the benefits of surgery and the potential risks including, but not limited to, persistent symptoms, infection, damage to nearby nerves and blood vessels, delayed wound healing, nonunion, malunion.    All patient concerns and questions were addressed.  A surgical date will be confirmed with the patient.    Follow-Up Instructions: No follow-ups on file.   Orders:  No orders of the defined types were placed in this encounter.  No orders of the defined types were placed in this encounter.     Procedures: No procedures performed   Clinical Data: No additional findings.   Subjective: Chief Complaint  Patient presents with   Right Hand - Fracture    DOI 03/13/2021    This is a 30 year old right-hand-dominant male who presents as an ER referral with dorsal ulnar right hand pain.  He punched a wall on Monday and was seen in the ER.  A CT was performed which showed a displaced fracture of the hamate body with some subluxation of the fourth and fifth CMC joints.  He was placed into a splint.  He continues to have pain with range of motion of his fingers.  His pain is 5 or 6 out of 10 and localized to the dorsal ulnar aspect of the hand around the hamate.  He is never injured this hand before.  He denies any numbness or paresthesias.  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   Review of Systems  Constitutional: Negative.   Respiratory: Negative.    Cardiovascular: Negative.   Musculoskeletal:  Positive for joint swelling.  Skin: Negative.   Neurological: Negative.   Psychiatric/Behavioral: Negative.      Objective: Vital Signs: Ht 5\' 11"  (1.803 m)   Wt 150 lb (68 kg)   BMI 20.92 kg/m   Physical Exam Constitutional:      Appearance: Normal appearance.  Cardiovascular:     Rate and Rhythm: Normal rate.     Pulses: Normal pulses.  Pulmonary:     Effort: Pulmonary effort is normal.  Skin:    General: Skin is warm and dry.     Capillary Refill: Capillary refill takes less than 2 seconds.  Neurological:     General: No focal deficit present.     Mental Status: He is alert.  Psychiatric:        Mood and Affect: Mood normal.    Right Hand Exam   Tenderness  Right hand tenderness location: TTP at dorsal aspect of hand over hamate, moderate swelling of the dorsum of his hand.  No wounds.  Range of Motion  Wrist  Right wrist extension: Limited by pain, swelling.  Right wrist flexion: Limited  by pain, swelling.   Muscle Strength  Right wrist normal muscle strength: Limited by pain, swelling.  Other  Erythema: absent Sensation: normal Pulse: present     Specialty Comments:  No specialty comments available.  Imaging: X-ray of the right hand taken in the ER on Monday reviewed interpreted by me.  Imaging demonstrates a difficult to characterize fracture of the hamate body.  The fifth CMC joint appears to be subluxed in these images.  CT scan of the right hand from his ER visit on Monday also reviewed by me.  The study demonstrates a coronal fracture through the hamate body with dorsal displacement of the dorsal fragment and mild subluxation of the fourth and fifth CMC joints.  There is a comminuted fracture involving the volar articular surface of the fourth  metacarpal.   PMFS History: Patient Active Problem List   Diagnosis Date Noted   Closed displaced fracture of body of right hamate bone 03/17/2021   No past medical history on file.  No family history on file.  No past surgical history on file. Social History   Occupational History   Not on file  Tobacco Use   Smoking status: Some Days    Packs/day: 0.50    Years: 3.00    Pack years: 1.50    Types: Cigarettes   Smokeless tobacco: Never  Vaping Use   Vaping Use: Every day  Substance and Sexual Activity   Alcohol use: Yes    Comment: Pt stated that he drinks beer on occasion   Drug use: Not Currently    Types: Marijuana   Sexual activity: Yes

## 2021-03-22 ENCOUNTER — Encounter (HOSPITAL_BASED_OUTPATIENT_CLINIC_OR_DEPARTMENT_OTHER): Payer: Self-pay | Admitting: Orthopedic Surgery

## 2021-03-22 ENCOUNTER — Other Ambulatory Visit: Payer: Self-pay

## 2021-03-23 ENCOUNTER — Other Ambulatory Visit: Payer: Self-pay

## 2021-03-23 ENCOUNTER — Encounter (HOSPITAL_BASED_OUTPATIENT_CLINIC_OR_DEPARTMENT_OTHER): Payer: Self-pay | Admitting: Orthopedic Surgery

## 2021-03-23 ENCOUNTER — Ambulatory Visit (HOSPITAL_BASED_OUTPATIENT_CLINIC_OR_DEPARTMENT_OTHER): Payer: 59 | Admitting: Anesthesiology

## 2021-03-23 ENCOUNTER — Ambulatory Visit (HOSPITAL_BASED_OUTPATIENT_CLINIC_OR_DEPARTMENT_OTHER): Payer: 59

## 2021-03-23 ENCOUNTER — Encounter (HOSPITAL_BASED_OUTPATIENT_CLINIC_OR_DEPARTMENT_OTHER)
Admission: RE | Disposition: A | Discharge status: Home / Self Care | Payer: Self-pay | Source: Home / Self Care | Attending: Orthopedic Surgery

## 2021-03-23 ENCOUNTER — Ambulatory Visit (HOSPITAL_BASED_OUTPATIENT_CLINIC_OR_DEPARTMENT_OTHER)
Admission: RE | Admit: 2021-03-23 | Discharge: 2021-03-23 | Disposition: A | Discharge status: Home / Self Care | Payer: 59 | Attending: Orthopedic Surgery | Admitting: Orthopedic Surgery

## 2021-03-23 DIAGNOSIS — S63054A Dislocation of other carpometacarpal joint of right hand, initial encounter: Secondary | ICD-10-CM | POA: Diagnosis not present

## 2021-03-23 DIAGNOSIS — F17210 Nicotine dependence, cigarettes, uncomplicated: Secondary | ICD-10-CM | POA: Insufficient documentation

## 2021-03-23 DIAGNOSIS — X58XXXA Exposure to other specified factors, initial encounter: Secondary | ICD-10-CM | POA: Diagnosis not present

## 2021-03-23 DIAGNOSIS — S62141A Displaced fracture of body of hamate [unciform] bone, right wrist, initial encounter for closed fracture: Secondary | ICD-10-CM | POA: Diagnosis not present

## 2021-03-23 HISTORY — DX: Displaced fracture of body of hamate (unciform) bone, unspecified wrist, initial encounter for closed fracture: S62.143A

## 2021-03-23 HISTORY — PX: CLOSED REDUCTION METACARPAL WITH PERCUTANEOUS PINNING: SHX5613

## 2021-03-23 SURGERY — CLOSED REDUCTION, FRACTURE, METACARPAL BONE, WITH PERCUTANEOUS PINNING
Anesthesia: General | Site: Hand | Laterality: Right

## 2021-03-23 MED ORDER — PROPOFOL 10 MG/ML IV BOLUS
INTRAVENOUS | Status: AC
  Filled 2021-03-23: qty 20

## 2021-03-23 MED ORDER — ONDANSETRON HCL 4 MG/2ML IJ SOLN
INTRAMUSCULAR | Status: AC
  Filled 2021-03-23: qty 2

## 2021-03-23 MED ORDER — CEFAZOLIN SODIUM-DEXTROSE 2-4 GM/100ML-% IV SOLN
INTRAVENOUS | Status: AC
  Filled 2021-03-23: qty 100

## 2021-03-23 MED ORDER — LIDOCAINE HCL (CARDIAC) PF 100 MG/5ML IV SOSY
PREFILLED_SYRINGE | INTRAVENOUS | Status: DC | PRN
  Administered 2021-03-23: 60 mg via INTRATRACHEAL

## 2021-03-23 MED ORDER — FENTANYL CITRATE (PF) 100 MCG/2ML IJ SOLN
100.0000 ug | Freq: Once | INTRAMUSCULAR | Status: AC
  Administered 2021-03-23: 100 ug via INTRAVENOUS

## 2021-03-23 MED ORDER — MIDAZOLAM HCL 2 MG/2ML IJ SOLN
INTRAMUSCULAR | Status: AC
  Filled 2021-03-23: qty 2

## 2021-03-23 MED ORDER — OXYCODONE HCL 5 MG/5ML PO SOLN: 5.0000 mg | Freq: Once | ORAL | Status: DC | PRN

## 2021-03-23 MED ORDER — FENTANYL CITRATE (PF) 100 MCG/2ML IJ SOLN
INTRAMUSCULAR | Status: AC
  Filled 2021-03-23: qty 2

## 2021-03-23 MED ORDER — DEXAMETHASONE SODIUM PHOSPHATE 10 MG/ML IJ SOLN
INTRAMUSCULAR | Status: DC | PRN
  Administered 2021-03-23: 10 mg via INTRAVENOUS

## 2021-03-23 MED ORDER — DEXAMETHASONE SODIUM PHOSPHATE 10 MG/ML IJ SOLN
INTRAMUSCULAR | Status: AC
  Filled 2021-03-23: qty 1

## 2021-03-23 MED ORDER — LIDOCAINE 2% (20 MG/ML) 5 ML SYRINGE
INTRAMUSCULAR | Status: AC
  Filled 2021-03-23: qty 5

## 2021-03-23 MED ORDER — FENTANYL CITRATE (PF) 100 MCG/2ML IJ SOLN
INTRAMUSCULAR | Status: DC | PRN
  Administered 2021-03-23: 50 ug via INTRAVENOUS

## 2021-03-23 MED ORDER — CEFAZOLIN SODIUM-DEXTROSE 2-4 GM/100ML-% IV SOLN
2.0000 g | INTRAVENOUS | Status: AC
  Administered 2021-03-23: 2 g via INTRAVENOUS

## 2021-03-23 MED ORDER — BUPIVACAINE-EPINEPHRINE (PF) 0.5% -1:200000 IJ SOLN
INTRAMUSCULAR | Status: DC | PRN
  Administered 2021-03-23: 30 mL via PERINEURAL

## 2021-03-23 MED ORDER — BUPIVACAINE-EPINEPHRINE (PF) 0.5% -1:200000 IJ SOLN: INTRAMUSCULAR | Status: DC | PRN

## 2021-03-23 MED ORDER — MIDAZOLAM HCL 2 MG/2ML IJ SOLN
2.0000 mg | Freq: Once | INTRAMUSCULAR | Status: AC
  Administered 2021-03-23: 2 mg via INTRAVENOUS

## 2021-03-23 MED ORDER — LACTATED RINGERS IV SOLN: INTRAVENOUS | Status: DC

## 2021-03-23 MED ORDER — PROPOFOL 10 MG/ML IV BOLUS
INTRAVENOUS | Status: DC | PRN
  Administered 2021-03-23: 200 mg via INTRAVENOUS

## 2021-03-23 MED ORDER — FENTANYL CITRATE (PF) 100 MCG/2ML IJ SOLN: 25.0000 ug | INTRAMUSCULAR | Status: DC | PRN

## 2021-03-23 MED ORDER — OXYCODONE HCL 5 MG PO TABS: 5.0000 mg | ORAL_TABLET | ORAL | 0 refills | Status: AC | PRN

## 2021-03-23 MED ORDER — ONDANSETRON HCL 4 MG/2ML IJ SOLN
4.0000 mg | Freq: Four times a day (QID) | INTRAMUSCULAR | Status: DC | PRN
Start: 2021-03-23 — End: 2021-03-23

## 2021-03-23 MED ORDER — OXYCODONE HCL 5 MG PO TABS: 5.0000 mg | ORAL_TABLET | Freq: Once | ORAL | Status: DC | PRN

## 2021-03-23 MED ORDER — ONDANSETRON HCL 4 MG/2ML IJ SOLN
INTRAMUSCULAR | Status: DC | PRN
  Administered 2021-03-23: 4 mg via INTRAVENOUS

## 2021-03-23 SURGICAL SUPPLY — 41 items
APL PRP STRL LF DISP 70% ISPRP (MISCELLANEOUS) ×1
BLADE SURG 15 STRL LF DISP TIS (BLADE) ×1 IMPLANT
BLADE SURG 15 STRL SS (BLADE) ×2
BNDG CMPR 9X4 STRL LF SNTH (GAUZE/BANDAGES/DRESSINGS)
BNDG ELASTIC 3X5.8 VLCR STR LF (GAUZE/BANDAGES/DRESSINGS) ×2 IMPLANT
BNDG ELASTIC 4X5.8 VLCR STR LF (GAUZE/BANDAGES/DRESSINGS) ×1 IMPLANT
BNDG ESMARK 4X9 LF (GAUZE/BANDAGES/DRESSINGS) ×1 IMPLANT
BNDG GAUZE ELAST 4 BULKY (GAUZE/BANDAGES/DRESSINGS) ×2 IMPLANT
BNDG PLASTER X FAST 4X5 WHT LF (CAST SUPPLIES) ×20 IMPLANT
BNDG PLSTR 5X4 XFST ST WHT LF (CAST SUPPLIES) ×10
CAP PIN PROTECTOR ORTHO WHT (CAP) ×4 IMPLANT
CHLORAPREP W/TINT 26 (MISCELLANEOUS) ×2 IMPLANT
CORD BIPOLAR FORCEPS 12FT (ELECTRODE) ×1 IMPLANT
COVER BACK TABLE 60X90IN (DRAPES) ×2 IMPLANT
COVER MAYO STAND STRL (DRAPES) ×2 IMPLANT
DRAPE EXTREMITY T 121X128X90 (DISPOSABLE) ×2 IMPLANT
DRAPE OEC MINIVIEW 54X84 (DRAPES) ×2 IMPLANT
DRAPE SURG 17X23 STRL (DRAPES) ×2 IMPLANT
GAUZE SPONGE 4X4 12PLY STRL (GAUZE/BANDAGES/DRESSINGS) ×2 IMPLANT
GAUZE XEROFORM 1X8 LF (GAUZE/BANDAGES/DRESSINGS) ×1 IMPLANT
GLOVE SURG ENC MOIS LTX SZ7 (GLOVE) ×2 IMPLANT
GLOVE SURG POLYISO LF SZ6.5 (GLOVE) ×1 IMPLANT
GLOVE SURG UNDER POLY LF SZ7 (GLOVE) ×2 IMPLANT
GOWN STRL REUS W/ TWL LRG LVL3 (GOWN DISPOSABLE) ×1 IMPLANT
GOWN STRL REUS W/ TWL XL LVL3 (GOWN DISPOSABLE) IMPLANT
GOWN STRL REUS W/TWL LRG LVL3 (GOWN DISPOSABLE) ×2
GOWN STRL REUS W/TWL XL LVL3 (GOWN DISPOSABLE) ×4 IMPLANT
K-WIRE .045X4 (WIRE) ×6 IMPLANT
NDL HYPO 25X1 1.5 SAFETY (NEEDLE) IMPLANT
NEEDLE HYPO 25X1 1.5 SAFETY (NEEDLE) ×2 IMPLANT
NS IRRIG 1000ML POUR BTL (IV SOLUTION) ×2 IMPLANT
PACK BASIN DAY SURGERY FS (CUSTOM PROCEDURE TRAY) ×2 IMPLANT
PAD CAST 3X4 CTTN HI CHSV (CAST SUPPLIES) ×1 IMPLANT
PADDING CAST ABS 4INX4YD NS (CAST SUPPLIES) ×1
PADDING CAST ABS COTTON 4X4 ST (CAST SUPPLIES) ×1 IMPLANT
PADDING CAST COTTON 3X4 STRL (CAST SUPPLIES) ×2
SLEEVE SCD COMPRESS KNEE MED (STOCKING) ×1 IMPLANT
SLING ARM FOAM STRAP XLG (SOFTGOODS) ×2 IMPLANT
STOCKINETTE 4X48 STRL (DRAPES) ×2 IMPLANT
TOWEL GREEN STERILE FF (TOWEL DISPOSABLE) ×4 IMPLANT
UNDERPAD 30X36 HEAVY ABSORB (UNDERPADS AND DIAPERS) ×2 IMPLANT

## 2021-03-23 NOTE — Anesthesia Preprocedure Evaluation (Addendum)
Anesthesia Evaluation  Patient identified by MRN, date of birth, ID band Patient awake    Reviewed: Allergy & Precautions, H&P , NPO status , Patient's Chart, lab work & pertinent test results  Airway Mallampati: II   Neck ROM: full    Dental   Pulmonary Current Smoker,    breath sounds clear to auscultation       Cardiovascular negative cardio ROS   Rhythm:regular Rate:Normal     Neuro/Psych    GI/Hepatic   Endo/Other    Renal/GU      Musculoskeletal   Abdominal   Peds  Hematology   Anesthesia Other Findings   Reproductive/Obstetrics                             Anesthesia Physical Anesthesia Plan  ASA: 2  Anesthesia Plan: General   Post-op Pain Management:  Regional for Post-op pain   Induction: Intravenous  PONV Risk Score and Plan: 1 and Ondansetron, Dexamethasone, Midazolam and Treatment may vary due to age or medical condition  Airway Management Planned: LMA  Additional Equipment:   Intra-op Plan:   Post-operative Plan: Extubation in OR  Informed Consent: I have reviewed the patients History and Physical, chart, labs and discussed the procedure including the risks, benefits and alternatives for the proposed anesthesia with the patient or authorized representative who has indicated his/her understanding and acceptance.     Dental advisory given  Plan Discussed with: CRNA, Anesthesiologist and Surgeon  Anesthesia Plan Comments:         Anesthesia Quick Evaluation

## 2021-03-23 NOTE — Op Note (Signed)
Date of Surgery: 03/23/2021  INDICATIONS: Nathaniel Jordan is a 30 y.o.-year-old male with a complex right comminuted hamate fracture with dislocation of the fourth and fifth carpometacarpal joints .  Discussed with the patient that he has a complex fracture involving the hamate body.  The fracture is comminuted in multiple planes.  There is a large coronal shear component of the fracture with an additional sagittal split and comminution of the dorsal fragment.  We discussed open reduction internal fixation of the hamate, however, I felt as though it would be very difficult to get good fixation in the comminuted dorsal cortex.  As such, we have elected to proceed with a closed reduction and percutaneous pinning of the fourth and fifth metacarpals.  Risks, benefits, and alternatives to surgery were discussed.  The Patient did consent to the procedure after extensive discussion.   PREOPERATIVE DIAGNOSIS:   Complex fracture of RIGHT hamate body  Dislocation of RIGHT fourth carpometacarpal joint  Dislocation of RIGHT fifth carpometacarpal joint  POSTOPERATIVE DIAGNOSIS: Same.  PROCEDURE: 1. Closed reduction and percutaneous pinning of RIGHT fourth and fifth carpometacarpal joint   SURGEON: Audria Nine, M.D.  ASSIST: None  ANESTHESIA:  Regional w/ MAC  IV FLUIDS AND URINE: See anesthesia.  ESTIMATED BLOOD LOSS: 1 mL.  IMPLANTS: 0.045 k-wires x 3  DRAINS: None  COMPLICATIONS: see description of procedure.  DESCRIPTION OF PROCEDURE: The patient was met in the preoperative holding area where the surgical site was marked and the consent form was verified.  The patient was then taken to the operating room and transferred to the operating table.  All bony prominences were well padded.  A tourniquet was applied to the right upper arm.  The operative extremity was prepped and draped in the usual and sterile fashion.  A formal time-out was performed to confirm that this was the correct patient,  surgery, side, and site.   The mini fluoroscopy machine was brought into the field and traction applied to the fourth and fifth ray.  The previous dislocation of the fourth and fifth carpometacarpal joints reduced with traction.  The joint line appeared symmetric and well maintained while under traction.  The articular margin of the distal hamate body also appeared better aligned under traction.  I then passed the first 0.045 K wire from ulnar to radial through the fifth, fourth, and into the third metacarpal base.  I then passed a second K wire just distal to this again going from the fifth metacarpal into the fourth and finally into the third metacarpal.  A third and final K wire was placed in a similar fashion distal to the previous two.  Final fluoroscopic imaging demonstrated an improved fourth and fifth carpometacarpal joint line on the PA view.  The lateral view confirmed that the wires were all within the fifth, fourth, and third metacarpals.  It also demonstrated that the previous subluxation or dislocation of the fourth and fifth metacarpals was reduced.  The wires were then bent and cut.  Pin caps were applied.  The wound was dressed with Xeroform 4 x 4's and a well-padded ulnar gutter splint.  The drapes were then taken down and the patient reversed of anesthesia.  He was transferred to the postoperative bed.  He was taken to the PACU in stable condition.  He will be discharged home from PACU.  POSTOPERATIVE PLAN: Discharge to home.  He has been given a prescription for postoperative pain medication.  He is to remain in the splint until  he is seen for follow up.  I will see him back in the office in 10-14 days.   Audria Nine, MD 2:38 PM

## 2021-03-23 NOTE — Transfer of Care (Signed)
Immediate Anesthesia Transfer of Care Note  Patient: Nathaniel Jordan  Procedure(s) Performed: OPEN REDUCTION INTERNAL FIXATION (ORIF) RIGHT HAMATE FRACTURE (Right: Hand)  Patient Location: PACU  Anesthesia Type:General  Level of Consciousness: drowsy and patient cooperative  Airway & Oxygen Therapy: Patient Spontanous Breathing and Patient connected to face mask oxygen  Post-op Assessment: Report given to RN and Post -op Vital signs reviewed and stable  Post vital signs: Reviewed and stable  Last Vitals:  Vitals Value Taken Time  BP    Temp    Pulse 84 03/23/21 1416  Resp 13 03/23/21 1416  SpO2 98 % 03/23/21 1416  Vitals shown include unvalidated device data.  Last Pain:  Vitals:   03/23/21 1141  TempSrc: Oral  PainSc: 0-No pain      Patients Stated Pain Goal: 2 (03/23/21 1141)  Complications: No notable events documented.

## 2021-03-23 NOTE — Anesthesia Postprocedure Evaluation (Signed)
Anesthesia Post Note  Patient: RACE LATOUR  Procedure(s) Performed: CLOSED REDUCTION HAMATE WITH PERCUTANEOUS PINNING (Right: Hand)     Patient location during evaluation: PACU Anesthesia Type: General and Regional Level of consciousness: awake and alert Pain management: pain level controlled Vital Signs Assessment: post-procedure vital signs reviewed and stable Respiratory status: spontaneous breathing, nonlabored ventilation, respiratory function stable and patient connected to nasal cannula oxygen Cardiovascular status: blood pressure returned to baseline and stable Postop Assessment: no apparent nausea or vomiting Anesthetic complications: no   No notable events documented.  Last Vitals:  Vitals:   03/23/21 1439 03/23/21 1506  BP: 131/82 111/75  Pulse: 85 80  Resp: 11 12  Temp:  36.7 C  SpO2: 95% 94%    Last Pain:  Vitals:   03/23/21 1506  TempSrc:   PainSc: 0-No pain                 Owin Vignola S

## 2021-03-23 NOTE — Anesthesia Procedure Notes (Signed)
Anesthesia Regional Block: Supraclavicular block   Pre-Anesthetic Checklist: , timeout performed,  Correct Patient, Correct Site, Correct Laterality,  Correct Procedure, Correct Position, site marked,  Risks and benefits discussed,  Surgical consent,  Pre-op evaluation,  At surgeon's request and post-op pain management  Laterality: Right  Prep: chloraprep       Needles:  Injection technique: Single-shot  Needle Type: Echogenic Stimulator Needle     Needle Length: 5cm  Needle Gauge: 22     Additional Needles:   Procedures:, nerve stimulator,,,,,     Nerve Stimulator or Paresthesia:  Response: biceps flexion, 0.45 mA  Additional Responses:   Narrative:  Start time: 03/23/2021 1:08 PM End time: 03/23/2021 1:18 PM Injection made incrementally with aspirations every 5 mL.  Performed by: Personally  Anesthesiologist: Achille Rich, MD  Additional Notes: Functioning IV was confirmed and monitors were applied.  A 32mm 22ga Arrow echogenic stimulator needle was used. Sterile prep and drape,hand hygiene and sterile gloves were used.  Negative aspiration and negative test dose prior to incremental administration of local anesthetic. The patient tolerated the procedure well.  Ultrasound guidance: relevent anatomy identified, needle position confirmed, local anesthetic spread visualized around nerve(s), vascular puncture avoided.  Image printed for medical record.

## 2021-03-23 NOTE — Progress Notes (Signed)
Assisted Dr. Hodierne with right, ultrasound guided, supraclavicular block. Side rails up, monitors on throughout procedure. See vital signs in flow sheet. Tolerated Procedure well.  

## 2021-03-23 NOTE — Interval H&P Note (Signed)
History and Physical Interval Note:  03/23/2021 1:06 PM  Nathaniel Jordan  has presented today for surgery, with the diagnosis of Right Hamate Fracture.  The various methods of treatment have been discussed with the patient and family. After consideration of risks, benefits and other options for treatment, the patient has consented to  Procedure(s): OPEN REDUCTION INTERNAL FIXATION (ORIF) RIGHT HAMATE FRACTURE (Right) as a surgical intervention.  The patient's history has been reviewed, patient examined, no change in status, stable for surgery.  I have reviewed the patient's chart and labs.  Questions were answered to the patient's satisfaction.     Afreen Siebels Phat Dalton

## 2021-03-23 NOTE — Discharge Instructions (Addendum)
Post Anesthesia Home Care Instructions  Activity: Get plenty of rest for the remainder of the day. A responsible individual must stay with you for 24 hours following the procedure.  For the next 24 hours, DO NOT: -Drive a car -Advertising copywriter -Drink alcoholic beverages -Take any medication unless instructed by your physician -Make any legal decisions or sign important papers.  Meals: Start with liquid foods such as gelatin or soup. Progress to regular foods as tolerated. Avoid greasy, spicy, heavy foods. If nausea and/or vomiting occur, drink only clear liquids until the nausea and/or vomiting subsides. Call your physician if vomiting continues.  Special Instructions/Symptoms: Your throat may feel dry or sore from the anesthesia or the breathing tube placed in your throat during surgery. If this causes discomfort, gargle with warm salt water. The discomfort should disappear within 24 hours.  If you had a scopolamine patch placed behind your ear for the management of post- operative nausea and/or vomiting:  1. The medication in the patch is effective for 72 hours, after which it should be removed.  Wrap patch in a tissue and discard in the trash. Wash hands thoroughly with soap and water. 2. You may remove the patch earlier than 72 hours if you experience unpleasant side effects which may include dry mouth, dizziness or visual disturbances. 3. Avoid touching the patch. Wash your hands with soap and water after contact with the patch.    Regional Anesthesia Blocks  1. Numbness or the inability to move the "blocked" extremity may last from 3-48 hours after placement. The length of time depends on the medication injected and your individual response to the medication. If the numbness is not going away after 48 hours, call your surgeon.  2. The extremity that is blocked will need to be protected until the numbness is gone and the  Strength has returned. Because you cannot feel it, you will  need to take extra care to avoid injury. Because it may be weak, you may have difficulty moving it or using it. You may not know what position it is in without looking at it while the block is in effect.  3. For blocks in the legs and feet, returning to weight bearing and walking needs to be done carefully. You will need to wait until the numbness is entirely gone and the strength has returned. You should be able to move your leg and foot normally before you try and bear weight or walk. You will need someone to be with you when you first try to ensure you do not fall and possibly risk injury.  4. Bruising and tenderness at the needle site are common side effects and will resolve in a few days.  5. Persistent numbness or new problems with movement should be communicated to the surgeon or the [facility] [phone] [facility] [phone]).       Waylan Rocher, M.D. Hand Surgery  POST-OPERATIVE DISCHARGE INSTRUCTIONS   PRESCRIPTIONS: You have been given a prescription to be taken as directed for post-operative pain control.  You may also take over the counter ibuprofen/aleve and tylenol for pain. Take this as directed on the packaging. Do not exceed 3000 mg tylenol/acetaminophen in 24 hours.  Ibuprofen 600-800 mg (3-4) tablets by mouth every 6 hours as needed for pain.  OR Aleve 2 tablets by mouth every 12 hours (twice daily) as needed for pain.  AND/OR Tylenol 1000 mg (2 tablets) every 8 hours as needed for pain.  Please  use your pain medication carefully, as refills are limited and you may not be provided with one.  As stated above, please use over the counter pain medicine - it will also be helpful with decreasing your swelling.    ANESTHESIA: After your surgery, post-surgical discomfort or pain is likely. This discomfort can last several days to a few weeks. At certain times of the day your discomfort may be more intense.   Did you receive a nerve  block?  A nerve block can provide pain relief for one hour to two days after your surgery. As long as the nerve block is working, you will experience little or no sensation in the area the surgeon operated on.  As the nerve block wears off, you will begin to experience pain or discomfort. It is very important that you begin taking your prescribed pain medication before the nerve block fully wears off. Treating your pain at the first sign of the block wearing off will ensure your pain is better controlled and more tolerable when full-sensation returns. Do not wait until the pain is intolerable, as the medicine will be less effective. It is better to treat pain in advance than to try and catch up.   General Anesthesia:  If you did not receive a nerve block during your surgery, you will need to start taking your pain medication shortly after your surgery and should continue to do so as prescribed by your surgeon.     ICE AND ELEVATION: You may use ice for the first 48-72 hours, but it is not critical.   Motion of your fingers is very important s to decrease the swelling. Please follow the finger range of motion exercises below to assist you in regaining your motion. This should be done at least 10 repetitions 3-4 times a day.  Elevation, as much as possible for the next 48 hours, is critical for decreasing swelling as well as for pain relief. Elevation means when you are seated or lying down, you hand should be at or above your heart. When walking, the hand needs to be at or above the level of your elbow.  If the bandage gets too tight, it may need to be loosened. Please contact our office and we will instruct you in how to do this.    SURGICAL BANDAGES:  Keep your dressing and/or splint clean and dry at all times.  Do not remove until you are seen again in the office.  If careful, you may place a plastic bag over your bandage and tape the end to shower, but be careful, do not get your bandages wet.      HAND THERAPY:  You may not need any. If you do, we will begin this at your follow up visit in the clinic.    ACTIVITY AND WORK: You are encouraged to move any fingers which are not in the bandage. Attached is an instruction sheet on finger motion. Do this as much as you need to make your fingers move fully and keep the swelling down.  Light use of the fingers is allowed to assist the other hand with daily hygiene and eating, but strong gripping or lifting is often uncomfortable and should be avoided.  You might miss a variable period of time from work and hopefully this issue has been discussed prior to surgery. You may not do any heavy work with your affected hand for about 2 weeks.    [facility] [address] [place],  [place]  [address] [phone]

## 2021-03-23 NOTE — Anesthesia Procedure Notes (Signed)
Procedure Name: LMA Insertion Date/Time: 03/23/2021 1:28 PM Performed by: Thornell Mule, CRNA Pre-anesthesia Checklist: Patient identified, Emergency Drugs available, Suction available and Patient being monitored Patient Re-evaluated:Patient Re-evaluated prior to induction Oxygen Delivery Method: Circle system utilized Preoxygenation: Pre-oxygenation with 100% oxygen Induction Type: IV induction LMA: LMA inserted LMA Size: 4.0 Number of attempts: 1 Placement Confirmation: positive ETCO2 Tube secured with: Tape Dental Injury: Teeth and Oropharynx as per pre-operative assessment

## 2021-03-23 NOTE — Brief Op Note (Signed)
03/23/2021  2:19 PM  PATIENT:  Nathaniel Jordan  30 y.o. male  PRE-OPERATIVE DIAGNOSIS:  Right Hamate Fracture  POST-OPERATIVE DIAGNOSIS:  Right Hamate Fracture  PROCEDURE:  Closed reduction and percutaneous pinning 4th and 5th metacarpophalangeal joints (RIGHT)  SURGEON:  Surgeon(s) and Role:    * Marlyne Beards, MD - Primary  PHYSICIAN ASSISTANT:   ASSISTANTS: none   ANESTHESIA:   regional  EBL:  None   BLOOD ADMINISTERED:none  DRAINS: none   LOCAL MEDICATIONS USED:  NONE  SPECIMEN:  No Specimen  DISPOSITION OF SPECIMEN:  N/A  COUNTS:  YES  TOURNIQUET:  * Missing tourniquet times found for documented tourniquets in log: [id number] *  DICTATION: .Reubin Milan Dictation  PLAN OF CARE: Discharge to home after PACU  PATIENT DISPOSITION:  PACU - hemodynamically stable.   Delay start of Pharmacological VTE agent (>24hrs) due to surgical blood loss or risk of bleeding: not applicable

## 2021-03-24 ENCOUNTER — Encounter (HOSPITAL_BASED_OUTPATIENT_CLINIC_OR_DEPARTMENT_OTHER): Payer: Self-pay | Admitting: Orthopedic Surgery

## 2021-04-04 ENCOUNTER — Ambulatory Visit (INDEPENDENT_AMBULATORY_CARE_PROVIDER_SITE_OTHER): Payer: 59

## 2021-04-04 ENCOUNTER — Encounter: Payer: Self-pay | Admitting: Orthopedic Surgery

## 2021-04-04 ENCOUNTER — Ambulatory Visit (INDEPENDENT_AMBULATORY_CARE_PROVIDER_SITE_OTHER): Payer: 59 | Admitting: Orthopedic Surgery

## 2021-04-04 ENCOUNTER — Other Ambulatory Visit: Payer: Self-pay

## 2021-04-04 DIAGNOSIS — S62141A Displaced fracture of body of hamate [unciform] bone, right wrist, initial encounter for closed fracture: Secondary | ICD-10-CM

## 2021-04-04 NOTE — Progress Notes (Signed)
Office Visit Note   Patient: Nathaniel Jordan           Date of Birth: 01-10-1991           MRN: [id number] Visit Date: 04/04/2021              Requested by: No referring provider defined for this encounter. PCP: Patient, No Pcp Per (Inactive)   Assessment & Plan: Visit Diagnoses:  1. Closed displaced fracture of body of hamate of right wrist, initial encounter     Plan: We again discussed the nature of his injury.  His hamate fracture was severely comminuted.  So much so that fixation of the dorsal cortex was not possible as there was not enough bone in which to get stable fixation.  We treated his fracture with distraction of the 4th and 5th MC to get them back out to length and they were pinned to the stable 3rd MC.  We will transition him from an ulnar gutter splint to an ulnar gutter cast.  We will keep him in a cast for two weeks at which point we will likely pull the pins and transition him to a thermoplast ulnar gutter brace.  We can see him back in another two weeks.   Follow-Up Instructions: No follow-ups on file.   Orders:  Orders Placed This Encounter  Procedures   XR Hand Complete Right   No orders of the defined types were placed in this encounter.     Procedures: No procedures performed   Clinical Data: No additional findings.   Subjective: Chief Complaint  Patient presents with   Right Wrist - Routine Post Op    Post hamate ORIF    This is a 30 year old right-hand-dominant male who presents for his first POV following close reduction and pinning of a right hamate fracture with subluxation of the 4th and 5th metacarpals.  He has been in a splint since the surgery.  His pain is well controlled.  He denies numbness or paresthesias in his hand.    Review of Systems  Constitutional: Negative.   Respiratory: Negative.    Cardiovascular: Negative.   Skin: Negative.     Objective: Vital Signs: There were no vitals taken for this visit.  Physical  Exam Cardiovascular:     Rate and Rhythm: Normal rate.     Pulses: Normal pulses.  Pulmonary:     Effort: Pulmonary effort is normal.  Skin:    General: Skin is warm and dry.     Capillary Refill: Capillary refill takes less than 2 seconds.  Neurological:     General: No focal deficit present.     Mental Status: He is alert.    Right Hand Exam   Comments:  Pin sites clean and dry.  K-wires in place without evidence of loosening or migration.  Still TTP over dorsal hamate.      Specialty Comments:  No specialty comments available.  Imaging: 4 views of the right hand taken today are reviewed and interpreted by me.  They demonstrate a step off at the articular surface of the hamate but without subluxation of the 4th and 5th metacarpals which was the goal of surgery.  There is still a visible sagittal fracture line through the hamate body on the oblique view.    PMFS History: Patient Active Problem List   Diagnosis Date Noted   Closed dislocation of fourth carpometacarpal joint of right hand    Closed dislocation of fifth carpometacarpal joint  of right hand    Closed displaced fracture of body of right hamate bone 03/17/2021   Past Medical History:  Diagnosis Date   Closed hamate fracture     No family history on file.  Past Surgical History:  Procedure Laterality Date   CLOSED REDUCTION METACARPAL WITH PERCUTANEOUS PINNING Right 03/23/2021   Procedure: CLOSED REDUCTION HAMATE WITH PERCUTANEOUS PINNING;  Surgeon: Marlyne Beards, MD;  Location: [facility];  Service: Orthopedics;  Laterality: Right;   NO PAST SURGERIES     Social History   Occupational History   Not on file  Tobacco Use   Smoking status: Some Days    Packs/day: 0.50    Years: 3.00    Pack years: 1.50    Types: Cigarettes   Smokeless tobacco: Never  Vaping Use   Vaping Use: Every day  Substance and Sexual Activity   Alcohol use: Yes    Comment: Pt stated that he drinks beer on  occasion   Drug use: Not Currently    Types: Marijuana    Comment: not since 2020   Sexual activity: Yes

## 2021-04-28 ENCOUNTER — Encounter: Payer: Self-pay | Admitting: Occupational Therapy

## 2021-04-28 ENCOUNTER — Encounter: Payer: Self-pay | Admitting: Orthopedic Surgery

## 2021-04-28 ENCOUNTER — Ambulatory Visit (INDEPENDENT_AMBULATORY_CARE_PROVIDER_SITE_OTHER): Payer: 59 | Admitting: Orthopedic Surgery

## 2021-04-28 ENCOUNTER — Other Ambulatory Visit: Payer: Self-pay

## 2021-04-28 ENCOUNTER — Ambulatory Visit (INDEPENDENT_AMBULATORY_CARE_PROVIDER_SITE_OTHER): Payer: 59 | Admitting: Occupational Therapy

## 2021-04-28 ENCOUNTER — Ambulatory Visit: Payer: Self-pay

## 2021-04-28 DIAGNOSIS — S62141A Displaced fracture of body of hamate [unciform] bone, right wrist, initial encounter for closed fracture: Secondary | ICD-10-CM | POA: Diagnosis not present

## 2021-04-28 DIAGNOSIS — M25641 Stiffness of right hand, not elsewhere classified: Secondary | ICD-10-CM | POA: Diagnosis not present

## 2021-04-28 DIAGNOSIS — M79641 Pain in right hand: Secondary | ICD-10-CM | POA: Diagnosis not present

## 2021-04-28 DIAGNOSIS — R278 Other lack of coordination: Secondary | ICD-10-CM | POA: Diagnosis not present

## 2021-04-28 DIAGNOSIS — M6281 Muscle weakness (generalized): Secondary | ICD-10-CM

## 2021-04-28 DIAGNOSIS — R600 Localized edema: Secondary | ICD-10-CM

## 2021-04-28 NOTE — Therapy (Signed)
[facility] [address] [place], [place], [address] Phone: [phone]   Fax:  [phone]  Occupational Therapy Evaluation  Patient Details  Name: Nathaniel Jordan MRN: [id number] Date of Birth: Mar 12, 1991 Referring Provider (OT): Dr Frazier Butt   Encounter Date: 04/28/2021   OT End of Session - 04/28/21 1615     Visit Number 1    Number of Visits 12    Date for OT Re-Evaluation 06/02/21   Every 10 visits   Authorization Type [employer]    Authorization - Visit Number 1    Progress Note Due on Visit 10    OT Start Time 1455    OT Stop Time 1552    OT Time Calculation (min) 57 min    Activity Tolerance Patient tolerated treatment well;No increased pain    Behavior During Therapy WFL for tasks assessed/performed             Past Medical History:  Diagnosis Date   Closed hamate fracture     Past Surgical History:  Procedure Laterality Date   CLOSED REDUCTION METACARPAL WITH PERCUTANEOUS PINNING Right 03/23/2021   Procedure: CLOSED REDUCTION HAMATE WITH PERCUTANEOUS PINNING;  Surgeon: Marlyne Beards, MD;  Location: [facility];  Service: Orthopedics;  Laterality: Right;   NO PAST SURGERIES      There were no vitals filed for this visit.   Subjective Assessment - 04/28/21 1504     Subjective  Pt states that he injured his right dominant hand when hit his hand against a wall. He sustained a 4th and 5th CMC and hammate fracture. His pins were removed today and he presents for protective custom ulnar gutter splinting.    Pertinent History Non significant PMH per pt report    Patient Stated Goals Get hand use back    Currently in Pain? Yes    Pain Score 2     Pain Location Hand    Pain Orientation Right    Pain Descriptors / Indicators Discomfort    Pain Type Acute pain    Pain Onset 1 to 4 weeks ago    Pain Frequency Intermittent    Aggravating Factors  Pin removal today in MD office    Pain Relieving Factors Pain  medication, rest    Multiple Pain Sites No               [facility] OT Assessment - 04/28/21 0001       Assessment   Medical Diagnosis R 4th & 5th CMC joint with communited distal Hammate fracture. Pins removed 04/28/21    Referring Provider (OT) Dr Frazier Butt    Onset Date/Surgical Date 04/22/21   ?03/23/21 however pt states DOS:04/22/21   Hand Dominance Right    Next MD Visit 4 weeks per pt report      Precautions   Precautions Other (comment)   As per fracture healing     Balance Screen   Has the patient fallen in the past 6 months No    Has the patient had a decrease in activity level because of a fear of falling?  No    Is the patient reluctant to leave their home because of a fear of falling?  No      Home  Environment   Family/patient expects to be discharged to: Private residence    Lives With Family      Prior Function   Level of Independence Independent    Vocation Full time employment   Pt  is currently out of work. He works as a Pharmacist, hospital driving, lifting, garbage truck duties      ADL   ADL comments Pt getting PRN assist for ADL's involving lifting, pushing and pulling as well as grip. Reports Mod I dressing/bathnig and basic ADL's      Mobility   Mobility Status Independent      Written Expression   Dominant Hand Right      Cognition   Overall Cognitive Status Within Functional Limits for tasks assessed      Coordination   Gross Motor Movements are Fluid and Coordinated No    Fine Motor Movements are Fluid and Coordinated No      Edema   Edema Moderate edema noted right dorsal hand and fingers      Hand Function   Comment NT secondary to fracture healing               OT Treatments/Exercises (OP) - 04/28/21 0001       ADLs   ADL Comments Pt was educated in splinting use, care and precautions. No lifting, pushing or pulling. He was also educated in general edem acontrol techniques or cryotherapy and elevation  as well as positioning during sleep, rest. He verbalized understanding of this in the clinic today.      Splinting   Splinting Pt was fitted with a custom fabricated ulnar gutter splint placing his right wrist in neutral, right 4th and 5th digits in ~45* flexion with IP's free for motion. Pt was educated in splinting use, care and precautions. He was educated that Dr Frazier Butt gave him the ok to remove for showering and will then don his splint for protection. He was given verbal and written instruction on all of the above today.             WEARING SCHEDULE:  Wear splint at ALL times except for hygiene care (you can remove it to shower, then dry your hand and put your protective splint back on). Do not use your hand to lift, push or pull anything until you're cleared by Dr Frazier Butt.   PURPOSE:  To prevent movement and for protection until injury can heal   CARE OF SPLINT:  Keep splint away from heat sources including: stove, radiator or furnace, or a car in sunlight. The splint can melt and will no longer fit you properly   Keep away from pets and children   Clean the splint with rubbing alcohol as needed.  * During this time, make sure you also clean your hand/arm as instructed by your therapist and/or perform dressing changes as needed. Then dry hand/arm completely before replacing splint. (When cleaning hand/arm, keep it immobilized in same position until splint is replaced)   PRECAUTIONS/POTENTIAL PROBLEMS: *If you notice or experience increased pain, swelling, numbness, or a lingering reddened area from the splint: Contact your therapist immediately by calling [phone]. You must wear the splint for protection, but we will get you scheduled for adjustments as quickly as possible.  (If only straps or hooks need to be replaced and NO adjustments to the splint need to be made, just call the office ahead and let them know you are coming in)   If you have any medical concerns or  signs of infection, please call your doctor immediately           Last Modified by Charletta Cousin, Udell Mazzocco B, OT/L on 04/28/2021 at  3:51 PM  OT Education - 04/28/21 1614     Education Details Splinting use, care and precautions, edema control R UE    Person(s) Educated Patient    Methods Explanation;Demonstration;Handout    Comprehension Verbalized understanding;Need further instruction              OT Short Term Goals - 04/28/21 1623       OT SHORT TERM GOAL #1   Title Pt will be Mod I splinting use, care and precautions R UE    Time 4    Period Weeks    Status New    Target Date 05/26/21      OT SHORT TERM GOAL #2   Title Pt will be Mod I initial HEP for R dominant hand    Time 4    Period Weeks    Status New    Target Date 05/26/21      OT SHORT TERM GOAL #3   Title Pt will be Mod I edema control techniques R UE as seen by independent ability to state in clinic    Time 4    Period Weeks    Status New    Target Date 05/26/21               OT Long Term Goals - 04/28/21 1625       OT LONG TERM GOAL #1   Title Pt will be Mod I updated HEP R hand    Time 8    Period Weeks    Status New    Target Date 06/23/21      OT LONG TERM GOAL #2   Title Pt will be Mod I making a flat fist with his R hand in preparation for return to home nad work related activity    Time 8    Period Weeks    Status New    Target Date 06/23/21      OT LONG TERM GOAL #3   Title Pt will reports pain in right hand as 3/10 or less during functional activity and ADL's    Time 8    Period Weeks    Status New    Target Date 06/23/21      OT LONG TERM GOAL #4   Title Pt will report increased strength as seen by 10# or greater grip as seen by JAMAR dynamometer grip in preparation for return to work related activity    Time 8    Period Weeks    Status New    Target Date 06/23/21      OT LONG TERM GOAL #5   Title Update and progress all goals as appropriate                    Plan - 04/28/21 1617     Clinical Impression Statement Pt is a 30 y/o right hand dominant male w/ no significant PMH that presents today for custom protective splinting following 4th, 5th CMC reduction following hammate fracture. Pt reports that his DOS: 03/23/21. His pins were removed by Dr Frazier Butt today. He presents with deficits in functional use of his right dominant hand and moderate edema noted. He should benefit from cont out-pt OT/and therapy for splinting, adjustments, pt/family education, HEP, ROM and edema control. Pt was fittedwith a custom ulnar gutter splint with neutral wrist and MCP of 4th and 5th digits in ~45* flexion and his IP's free for motion. He was also educated verbally in basic edema control techniques. He  will f/u in 1 week for splint check and adjustment and progression of HEP/edema control.    OT Occupational Profile and History Problem Focused Assessment - Including review of records relating to presenting problem    Occupational performance deficits (Please refer to evaluation for details): ADL's;Work    Body Structure / Function / Physical Skills ADL;Strength;Dexterity;Pain;Edema;UE functional use;ROM;Coordination;Flexibility;Mobility;Decreased knowledge of precautions;FMC    Rehab Potential Good    Clinical Decision Making Limited treatment options, no task modification necessary    Comorbidities Affecting Occupational Performance: None    Modification or Assistance to Complete Evaluation  No modification of tasks or assist necessary to complete eval    OT Frequency Other (comment)   1-2x/week for up to 12 visits   OT Duration 12 weeks    OT Treatment/Interventions Self-care/ADL training;Moist Heat;Fluidtherapy;Splinting;Therapeutic activities;Therapeutic exercise;Cryotherapy;Passive range of motion;Paraffin;Manual Therapy;Patient/family education    Plan Splint check and adjustments R, edema control and HEP as per Dr Frazier Butt order.    Consulted and  Agree with Plan of Care Patient             Patient will benefit from skilled therapeutic intervention in order to improve the following deficits and impairments:   Body Structure / Function / Physical Skills: ADL, Strength, Dexterity, Pain, Edema, UE functional use, ROM, Coordination, Flexibility, Mobility, Decreased knowledge of precautions, [facility]       Visit Diagnosis: Pain in right hand - Plan: Ot plan of care cert/re-cert  Muscle weakness (generalized) - Plan: Ot plan of care cert/re-cert  Stiffness of right hand, not elsewhere classified - Plan: Ot plan of care cert/re-cert  Other lack of coordination - Plan: Ot plan of care cert/re-cert  Localized edema - Plan: Ot plan of care cert/re-cert    Problem List Patient Active Problem List   Diagnosis Date Noted   Closed dislocation of fourth carpometacarpal joint of right hand    Closed dislocation of fifth carpometacarpal joint of right hand    Closed displaced fracture of body of right hamate bone 03/17/2021    Coulton Schlink Dionicio Stall, OT/L 04/28/2021, 4:35 PM  [facility] [address] [place], [place], [address] Phone: [phone]   Fax:  [phone]  Name: Nathaniel Jordan MRN: [id number] Date of Birth: [birth year]

## 2021-04-28 NOTE — Progress Notes (Signed)
   Post-Op Visit Note   Patient: Nathaniel Jordan           Date of Birth: 12/03/1990           MRN: [id number] Visit Date: 04/28/2021 PCP: Patient, No Pcp Per (Inactive)   Assessment & Plan:  Chief Complaint:  Chief Complaint  Patient presents with   Right Hand - Routine Post Op    03/23/21 right hand closed reduction hamate with percutaneous pinning   Visit Diagnoses:  1. Closed displaced fracture of body of hamate of right wrist, initial encounter     Plan: Pt was taken out of his cast today.  K-wires were removed uneventfully.  X-rays demonstrate maintained reduction of the 4th and 5th CMC joints with interval healing of the comminuted distal hamate articular surface.  At this point, we will start him in therapy to work on edema control and ROM.  He will start wearing a thermoplast orthosis but may take it off to shower.  He should remain out of work for another 4 weeks.   Follow-Up Instructions: No follow-ups on file.   Orders:  Orders Placed This Encounter  Procedures   XR Hand Complete Right   Ambulatory referral to Occupational Therapy   No orders of the defined types were placed in this encounter.   Imaging: No results found.  PMFS History: Patient Active Problem List   Diagnosis Date Noted   Closed dislocation of fourth carpometacarpal joint of right hand    Closed dislocation of fifth carpometacarpal joint of right hand    Closed displaced fracture of body of right hamate bone 03/17/2021   Past Medical History:  Diagnosis Date   Closed hamate fracture     No family history on file.  Past Surgical History:  Procedure Laterality Date   CLOSED REDUCTION METACARPAL WITH PERCUTANEOUS PINNING Right 03/23/2021   Procedure: CLOSED REDUCTION HAMATE WITH PERCUTANEOUS PINNING;  Surgeon: Marlyne Beards, MD;  Location: [facility];  Service: Orthopedics;  Laterality: Right;   NO PAST SURGERIES     Social History   Occupational History   Not on  file  Tobacco Use   Smoking status: Some Days    Packs/day: 0.50    Years: 3.00    Pack years: 1.50    Types: Cigarettes   Smokeless tobacco: Never  Vaping Use   Vaping Use: Every day  Substance and Sexual Activity   Alcohol use: Yes    Comment: Pt stated that he drinks beer on occasion   Drug use: Not Currently    Types: Marijuana    Comment: not since 2020   Sexual activity: Yes

## 2021-04-28 NOTE — Patient Instructions (Signed)
WEARING SCHEDULE:  Wear splint at ALL times except for hygiene care (you can remove it to shower, then dry your hand and put your protective splint back on). Do not use your hand to lift, push or pull anything until you're cleared by Dr Frazier Butt.  PURPOSE:  To prevent movement and for protection until injury can heal  CARE OF SPLINT:  Keep splint away from heat sources including: stove, radiator or furnace, or a car in sunlight. The splint can melt and will no longer fit you properly  Keep away from pets and children  Clean the splint with rubbing alcohol as needed.  * During this time, make sure you also clean your hand/arm as instructed by your therapist and/or perform dressing changes as needed. Then dry hand/arm completely before replacing splint. (When cleaning hand/arm, keep it immobilized in same position until splint is replaced)  PRECAUTIONS/POTENTIAL PROBLEMS: *If you notice or experience increased pain, swelling, numbness, or a lingering reddened area from the splint: Contact your therapist immediately by calling [phone]. You must wear the splint for protection, but we will get you scheduled for adjustments as quickly as possible.  (If only straps or hooks need to be replaced and NO adjustments to the splint need to be made, just call the office ahead and let them know you are coming in)  If you have any medical concerns or signs of infection, please call your doctor immediately

## 2021-05-01 ENCOUNTER — Other Ambulatory Visit: Payer: Self-pay | Admitting: Orthopedic Surgery

## 2021-05-01 MED ORDER — CEPHALEXIN 500 MG PO CAPS: 500.0000 mg | ORAL_CAPSULE | Freq: Four times a day (QID) | ORAL | 0 refills | Status: AC

## 2021-05-01 NOTE — Progress Notes (Unsigned)
f °

## 2021-05-10 ENCOUNTER — Encounter: Payer: Self-pay | Admitting: Orthopedic Surgery

## 2021-05-10 ENCOUNTER — Ambulatory Visit (INDEPENDENT_AMBULATORY_CARE_PROVIDER_SITE_OTHER): Payer: 59 | Admitting: Occupational Therapy

## 2021-05-10 ENCOUNTER — Telehealth: Payer: Self-pay | Admitting: Orthopedic Surgery

## 2021-05-10 ENCOUNTER — Encounter: Payer: Self-pay | Admitting: Occupational Therapy

## 2021-05-10 ENCOUNTER — Other Ambulatory Visit: Payer: Self-pay

## 2021-05-10 DIAGNOSIS — R600 Localized edema: Secondary | ICD-10-CM

## 2021-05-10 DIAGNOSIS — R278 Other lack of coordination: Secondary | ICD-10-CM

## 2021-05-10 DIAGNOSIS — M25641 Stiffness of right hand, not elsewhere classified: Secondary | ICD-10-CM | POA: Diagnosis not present

## 2021-05-10 DIAGNOSIS — M6281 Muscle weakness (generalized): Secondary | ICD-10-CM

## 2021-05-10 DIAGNOSIS — M79641 Pain in right hand: Secondary | ICD-10-CM | POA: Diagnosis not present

## 2021-05-10 NOTE — Telephone Encounter (Signed)
Note has been created and given to patient.

## 2021-05-10 NOTE — Telephone Encounter (Signed)
Patient is here for OT. He would like a work note stating that he is unable to drive at the moment. His number is [phone]

## 2021-05-10 NOTE — Therapy (Signed)
[facility] Physical Therapy [address] [place], [place], [address] Phone: [phone]   Fax:  [phone]  Occupational Therapy Treatment  Patient Details  Name: Nathaniel Jordan MRN: [id number] Date of Birth: 1990/09/17 Referring Provider (OT): Dr Frazier Butt   Encounter Date: 05/10/2021   OT End of Session - 05/10/21 1236     Visit Number 2    Number of Visits 12    Date for OT Re-Evaluation 06/02/21   Every 10 visits   Authorization Type U[employer]    Authorization - Visit Number 2    Progress Note Due on Visit 10    OT Start Time 1017    OT Stop Time 1048    OT Time Calculation (min) 31 min    Activity Tolerance Patient tolerated treatment well;No increased pain    Behavior During Therapy WFL for tasks assessed/performed             Past Medical History:  Diagnosis Date   Closed hamate fracture     Past Surgical History:  Procedure Laterality Date   CLOSED REDUCTION METACARPAL WITH PERCUTANEOUS PINNING Right 03/23/2021   Procedure: CLOSED REDUCTION HAMATE WITH PERCUTANEOUS PINNING;  Surgeon: Marlyne Beards, MD;  Location: [facility];  Service: Orthopedics;  Laterality: Right;   NO PAST SURGERIES      There were no vitals filed for this visit.   Subjective Assessment - 05/10/21 1020     Subjective  Pt reports that his splint is fitting well right. He states that he is not having any pain but sometimes "throbbing"    Pertinent History Non significant PMH per pt report    Patient Stated Goals Get hand use back    Currently in Pain? Yes    Pain Score 2     Pain Location Hand    Pain Orientation Right    Pain Descriptors / Indicators Throbbing    Pain Onset More than a month ago    Pain Frequency Intermittent    Multiple Pain Sites No                  OT Treatments/Exercises (OP) - 05/10/21 0001       ADLs   ADL Comments Reviewed splinting use, care and precautions. No lifting, pushing or pulling. He was also  educated in edema control techniques, edema glove use and ice and elevation as well as gentle retrograde massage & positioning during sleep, rest. He verbalized understanding of this in the clinic today.      Exercises   Exercises Hand      Hand Exercises   PIPJ Flexion Right;10 reps;AAROM   Active blocked flexion ex's within protective ulnar gutter splint   PIPJ Extension AAROM;Right;10 reps   Active blocked flexion ex's within protective ulnar gutter splint   DIPJ Flexion AAROM;Right;10 reps   Active blocked flexion ex's within protective ulnar gutter splint   DIPJ Extension AAROM;Right;10 reps   Active blocked flexion ex's within protective ulnar gutter splint     Splinting   Splinting R ulnar gutter splint check and minor adjustments today. Replaced strap and several velcro pieces. Splint appears to be well fitting and pt was w/o c/o pressure areas etc. Pt cont to have significant edema noted right dorsal hand, expecially proximal phalanx of 4th and 5th digits.      Manual Therapy   Manual Therapy Edema management;Other (comment)   Gentle retrograde massage right dorsal hand x10 min. Issued edema control glove and compressive stockinette.  Pt was educated in use, care and precautions for both of these and he verbalized understanding in the clinic today.               OT Education - 05/10/21 1235     Education Details Splint use, care and precuations, edema control and edema glove use, care and precautions right hand.    Person(s) Educated Patient    Methods Explanation;Demonstration    Comprehension Verbalized understanding;Need further instruction              OT Short Term Goals - 05/10/21 1240       OT SHORT TERM GOAL #1   Title Pt will be Mod I splinting use, care and precautions R UE    Time 4    Period Weeks    Status Achieved    Target Date 05/26/21      OT SHORT TERM GOAL #2   Title Pt will be Mod I initial HEP for R dominant hand    Time 4    Period  Weeks    Status On-going    Target Date 05/26/21      OT SHORT TERM GOAL #3   Title Pt will be Mod I edema control techniques R UE as seen by independent ability to state in clinic    Time 4    Period Weeks    Status New    Target Date 05/26/21               OT Long Term Goals - 04/28/21 1625       OT LONG TERM GOAL #1   Title Pt will be Mod I updated HEP R hand    Time 8    Period Weeks    Status New    Target Date 06/23/21      OT LONG TERM GOAL #2   Title Pt will be Mod I making a flat fist with his R hand in preparation for return to home nad work related activity    Time 8    Period Weeks    Status New    Target Date 06/23/21      OT LONG TERM GOAL #3   Title Pt will reports pain in right hand as 3/10 or less during functional activity and ADL's    Time 8    Period Weeks    Status New    Target Date 06/23/21      OT LONG TERM GOAL #4   Title Pt will report increased strength as seen by 10# or greater grip as seen by JAMAR dynamometer grip in preparation for return to work related activity    Time 8    Period Weeks    Status New    Target Date 06/23/21      OT LONG TERM GOAL #5   Title Update and progress all goals as appropriate                Plan - 05/10/21 1236     Clinical Impression Statement Pt was seen for splint check and minor adjustments today to protective ulnar gutter splint right, as well as pt education in edema control techniques, retrograde massage and HEP review/performance. Edema continues to be limiting factor. Pt denies pain but reports intermittent "throbbing" rates it as 2/10. He should benefit from cont out pt OT/hand therapy for splinting and progression of HEP as able R hand.    OT Occupational Profile and History Problem Focused  Assessment - Including review of records relating to presenting problem    Occupational performance deficits (Please refer to evaluation for details): ADL's;Work    Body Structure / Function /  Physical Skills ADL;Strength;Dexterity;Pain;Edema;UE functional use;ROM;Coordination;Flexibility;Mobility;Decreased knowledge of precautions;FMC    Rehab Potential Good    Clinical Decision Making Limited treatment options, no task modification necessary    Comorbidities Affecting Occupational Performance: None    Modification or Assistance to Complete Evaluation  No modification of tasks or assist necessary to complete eval    OT Frequency Other (comment)   1-2x/week for up to 12 visits   OT Duration 12 weeks    OT Treatment/Interventions Self-care/ADL training;Moist Heat;Fluidtherapy;Splinting;Therapeutic activities;Therapeutic exercise;Cryotherapy;Passive range of motion;Paraffin;Manual Therapy;Patient/family education    Plan Splint check and adjustments R, edema control and HEP as per Dr Frazier Butt order.    Consulted and Agree with Plan of Care Patient             Patient will benefit from skilled therapeutic intervention in order to improve the following deficits and impairments:   Body Structure / Function / Physical Skills: ADL, Strength, Dexterity, Pain, Edema, UE functional use, ROM, Coordination, Flexibility, Mobility, Decreased knowledge of precautions, [facility]       Visit Diagnosis: Pain in right hand  Muscle weakness (generalized)  Stiffness of right hand, not elsewhere classified  Other lack of coordination  Localized edema    Problem List Patient Active Problem List   Diagnosis Date Noted   Closed dislocation of fourth carpometacarpal joint of right hand    Closed dislocation of fifth carpometacarpal joint of right hand    Closed displaced fracture of body of right hamate bone 03/17/2021    Alm Bustard, OT/L 05/10/2021, 12:45 PM  [facility] Physical Therapy [address] [place], [place], [address] Phone: [phone]   Fax:  [phone]  Name: Nathaniel Jordan MRN: [id number] Date of Birth: 1991/02/28

## 2021-05-17 ENCOUNTER — Encounter: Payer: Self-pay | Admitting: Occupational Therapy

## 2021-05-17 ENCOUNTER — Other Ambulatory Visit: Payer: Self-pay

## 2021-05-17 ENCOUNTER — Ambulatory Visit (INDEPENDENT_AMBULATORY_CARE_PROVIDER_SITE_OTHER): Payer: 59 | Admitting: Occupational Therapy

## 2021-05-17 DIAGNOSIS — R600 Localized edema: Secondary | ICD-10-CM

## 2021-05-17 DIAGNOSIS — M25641 Stiffness of right hand, not elsewhere classified: Secondary | ICD-10-CM

## 2021-05-17 DIAGNOSIS — M6281 Muscle weakness (generalized): Secondary | ICD-10-CM

## 2021-05-17 DIAGNOSIS — M79641 Pain in right hand: Secondary | ICD-10-CM | POA: Diagnosis not present

## 2021-05-17 DIAGNOSIS — R278 Other lack of coordination: Secondary | ICD-10-CM

## 2021-05-17 NOTE — Therapy (Signed)
[facility] Physical Therapy [address] [place], [place], [address] Phone: [phone]   Fax:  [phone]  Occupational Therapy Treatment  Patient Details  Name: Nathaniel Jordan MRN: [id number] Date of Birth: 12/24/1990 Referring Provider (OT): Dr Frazier Butt   Encounter Date: 05/17/2021   OT End of Session - 05/17/21 1406     Visit Number 3    Number of Visits 12    Date for OT Re-Evaluation 06/02/21   Every 10 visits   Authorization Type UHC    Authorization - Visit Number 3    Progress Note Due on Visit 10    OT Start Time 1016    OT Stop Time 1101    OT Time Calculation (min) 45 min    Activity Tolerance Patient tolerated treatment well;No increased pain    Behavior During Therapy WFL for tasks assessed/performed             Past Medical History:  Diagnosis Date   Closed hamate fracture     Past Surgical History:  Procedure Laterality Date   CLOSED REDUCTION METACARPAL WITH PERCUTANEOUS PINNING Right 03/23/2021   Procedure: CLOSED REDUCTION HAMATE WITH PERCUTANEOUS PINNING;  Surgeon: Marlyne Beards, MD;  Location: [facility];  Service: Orthopedics;  Laterality: Right;   NO PAST SURGERIES      There were no vitals filed for this visit.   Subjective Assessment - 05/17/21 1020     Subjective  Pt reports wearing splint on right hand at all times, denies pain.    Pertinent History Non significant PMH per pt report    Patient Stated Goals Get hand use back    Currently in Pain? No/denies    Pain Score 0-No pain    Multiple Pain Sites No                          OT Treatments/Exercises (OP) - 05/17/21 0001       ADLs   ADL Comments Reviewed splinting use, care and precautions. Pt may now remove for HEP and reapply after ex's.      Exercises   Exercises Hand      Hand Exercises   Other Hand Exercises Upgraded HEP to include removing splint and performing tendon gliding ex's (finger extension, MCP  flexion with IP extension - lumbrical stretch, hook, composite and flat fist. Pt performed in clinic and then reapplied splint after instruction.    Other Hand Exercises Pt will perform blocked PIP/DIP flexion ex's independently as previously instructed      Splinting   Splinting Remolded protective R ulnar gutter splint to allow for increased MCP flexion and account for loss of some edema in forearm/hand. Splint is well fitting. Several velcro pieces were replaced as well.      Manual Therapy   Manual Therapy Edema management;Other (comment)   Gentle retrograde massage right volar/dorsal hand x10 min. Reviewed edema control glove and compressive stockinette. Pt was mod I  use, care and precautions for both of these and he verbalized understanding in the clinic today.                   OT Education - 05/17/21 1406     Education Details Upgraded HEP, edema control, splint wear and care between ex sessions.    Person(s) Educated Patient    Methods Explanation;Demonstration    Comprehension Verbalized understanding;Need further instruction  OT Short Term Goals - 05/17/21 1412       OT SHORT TERM GOAL #1   Title Pt will be Mod I splinting use, care and precautions R UE    Time 4    Period Weeks    Status Achieved    Target Date 05/26/21      OT SHORT TERM GOAL #2   Title Pt will be Mod I initial HEP for R dominant hand    Time 4    Period Weeks    Status On-going    Target Date 05/26/21      OT SHORT TERM GOAL #3   Title Pt will be Mod I edema control techniques R UE as seen by independent ability to state in clinic    Time 4    Period Weeks    Status Achieved    Target Date 05/26/21               OT Long Term Goals - 04/28/21 1625       OT LONG TERM GOAL #1   Title Pt will be Mod I updated HEP R hand    Time 8    Period Weeks    Status New    Target Date 06/23/21      OT LONG TERM GOAL #2   Title Pt will be Mod I making a flat fist  with his R hand in preparation for return to home nad work related activity    Time 8    Period Weeks    Status New    Target Date 06/23/21      OT LONG TERM GOAL #3   Title Pt will reports pain in right hand as 3/10 or less during functional activity and ADL's    Time 8    Period Weeks    Status New    Target Date 06/23/21      OT LONG TERM GOAL #4   Title Pt will report increased strength as seen by 10# or greater grip as seen by JAMAR dynamometer grip in preparation for return to work related activity    Time 8    Period Weeks    Status New    Target Date 06/23/21      OT LONG TERM GOAL #5   Title Update and progress all goals as appropriate                   Plan - 05/17/21 1407     Clinical Impression Statement Pt is currently 8 weeks post op surgical repair to right comminuted hamate fracture with dislocation of the fourth and fifth carpometacarpal joints. His HEP was upgraded to include HEP out of splint and then re-applying after exercises. Pt will also benefit from continued edema control right hand as his dorsal dominant hand remains with moderate edema.Splint adjustment today for increased MCP flexion and a better fit overall.    OT Occupational Profile and History Problem Focused Assessment - Including review of records relating to presenting problem    Occupational performance deficits (Please refer to evaluation for details): ADL's;Work    Body Structure / Function / Physical Skills ADL;Strength;Dexterity;Pain;Edema;UE functional use;ROM;Coordination;Flexibility;Mobility;Decreased knowledge of precautions;FMC    Rehab Potential Good    Clinical Decision Making Limited treatment options, no task modification necessary    Comorbidities Affecting Occupational Performance: None    Modification or Assistance to Complete Evaluation  No modification of tasks or assist necessary to complete eval  OT Frequency Other (comment)   1-2x/week up to 12 visits   OT  Treatment/Interventions Self-care/ADL training;Moist Heat;Fluidtherapy;Splinting;Therapeutic activities;Therapeutic exercise;Cryotherapy;Passive range of motion;Paraffin;Manual Therapy;Patient/family education    Plan Splint check and adjustments R, edema control, review HEP as per Dr Frazier Butt orders.    Consulted and Agree with Plan of Care Patient             Patient will benefit from skilled therapeutic intervention in order to improve the following deficits and impairments:   Body Structure / Function / Physical Skills: ADL, Strength, Dexterity, Pain, Edema, UE functional use, ROM, Coordination, Flexibility, Mobility, Decreased knowledge of precautions, [facility]       Visit Diagnosis: Pain in right hand  Muscle weakness (generalized)  Stiffness of right hand, not elsewhere classified  Other lack of coordination  Localized edema    Problem List Patient Active Problem List   Diagnosis Date Noted   Closed dislocation of fourth carpometacarpal joint of right hand    Closed dislocation of fifth carpometacarpal joint of right hand    Closed displaced fracture of body of right hamate bone 03/17/2021    Alm Bustard, OT/L 05/17/2021, 2:14 PM  [facility] Physical Therapy [address] [place], [place], [address] Phone: [phone]   Fax:  [phone]  Name: Nathaniel Jordan MRN: [id number] Date of Birth: 01/09/1991  Finger MP Joints    Using injured hand , bend all of your fingers at the back knuckles (make a 7). Hold in this position for 5 seconds, then straighten and repeat. Repeat _10__ times per session. Do __3-4_ sessions per day.   Flexor Tendon Gliding (Active Hook Fist)    With fingers and knuckles straight, bend middle and tip joints. Do not bend large knuckles. Repeat __10__ times. Hold this position for 5 seconds. Do __3-4__ sessions per day.  Flexor Tendon Gliding (Active Straight Fist)    Start with fingers straight.  Bend knuckles and middle joints. Keep fingertip joints straight to touch base of palm. Repeat _10___ times. Hold this position for 5 seconds. Do __3-4__ sessions per day.  Flexor Tendon Gliding (Active Full Fist)    Straighten all fingers, then make a fist, bending all joints. Repeat __10__ times. Hold in this position for 5 seconds. Do __3-4__ sessions per day.   Put your splint back on after doing your exercises.  Stop any exercises that cause pain (you should not feel more than a stretch or 2-3/10 on a scale of 1-10).

## 2021-05-17 NOTE — Patient Instructions (Signed)
Finger MP Joints    Using injured hand , bend all of your fingers at the back knuckles (make a 7). Hold in this position for 5 seconds, then straighten and repeat. Repeat _10__ times per session. Do __3-4_ sessions per day.   Flexor Tendon Gliding (Active Hook Fist)    With fingers and knuckles straight, bend middle and tip joints. Do not bend large knuckles. Repeat __10__ times. Hold this position for 5 seconds. Do __3-4__ sessions per day.  Flexor Tendon Gliding (Active Straight Fist)    Start with fingers straight. Bend knuckles and middle joints. Keep fingertip joints straight to touch base of palm. Repeat _10___ times. Hold this position for 5 seconds. Do __3-4__ sessions per day.  Flexor Tendon Gliding (Active Full Fist)    Straighten all fingers, then make a fist, bending all joints. Repeat __10__ times. Hold in this position for 5 seconds. Do __3-4__ sessions per day.   Put your splint back on after doing your exercises.  Stop any exercises that cause pain (you should not feel more than a stretch or 2-3/10 on a scale of 1-10).

## 2021-05-24 ENCOUNTER — Other Ambulatory Visit: Payer: Self-pay

## 2021-05-24 ENCOUNTER — Encounter: Payer: Self-pay | Admitting: Occupational Therapy

## 2021-05-24 ENCOUNTER — Ambulatory Visit (INDEPENDENT_AMBULATORY_CARE_PROVIDER_SITE_OTHER): Payer: 59 | Admitting: Occupational Therapy

## 2021-05-24 DIAGNOSIS — R278 Other lack of coordination: Secondary | ICD-10-CM

## 2021-05-24 DIAGNOSIS — M6281 Muscle weakness (generalized): Secondary | ICD-10-CM

## 2021-05-24 DIAGNOSIS — R600 Localized edema: Secondary | ICD-10-CM

## 2021-05-24 DIAGNOSIS — M25641 Stiffness of right hand, not elsewhere classified: Secondary | ICD-10-CM | POA: Diagnosis not present

## 2021-05-24 DIAGNOSIS — M79641 Pain in right hand: Secondary | ICD-10-CM

## 2021-05-24 NOTE — Patient Instructions (Signed)
Flexion (Active With Finger Extension)    Let hand dangle over edge of table, palm down. Bend wrist downward as far as possible. Hold __10__ seconds. Repeat _10___ times. Do __4-6_ sessions per day.  Extension (Active With Finger Extension)    With forearm on table and wrist over edge, lift hand with fingers straight. Hold __10__ seconds. Repeat __10__ times. Do _4-6__ sessions per day.  FINGERS: Extension    Use opposite hand to straighten fingers. Hold _10__ seconds. _10_ reps per set, _4-6_ sets per day You can do this at the wall - wall stretch or as a prayer stretch bt putting yur hands together and stretching/straightening your fingers.  Ulnar Deviation (Passive)    With palm and wrist on table, place other hand on top to steady while bringing elbow outward. Hold __10__ seconds. Repeat _10___ times. Do _4-6__ sessions per day.  Radial Deviation (Passive)    With wrist and palm on table, place other hand on top to keep steady while bringing elbow inward. Hold _10___ seconds. Repeat _10___ times. Do __4-6__ sessions per day.   Flexor Tendon Gliding (Active Full Fist)    Straighten all fingers, then make a fist, bending all joints. Repeat __10__ times. Do __4-6__ sessions per day.  Flexor Tendon Gliding (Active Hook Fist)    With fingers and knuckles straight, bend middle and tip joints. Do not bend large knuckles. Repeat _10___ times. Do __4-6__ sessions per day.  Flexor Tendon Gliding (Active Straight Fist)    Start with fingers straight. Bend knuckles and middle joints. Keep fingertip joints straight to touch base of palm. Repeat _10___ times. Do __4-6_ sessions per day.

## 2021-05-24 NOTE — Therapy (Signed)
[facility] Physical Therapy [address] [place], [place], [address] Phone: [phone]   Fax:  [phone]  Occupational Therapy Treatment  Patient Details  Name: Nathaniel Jordan MRN: [id number] Date of Birth: 27-Apr-1991 Referring Provider (OT): Dr Frazier Butt   Encounter Date: 05/24/2021   OT End of Session - 05/24/21 1322     Visit Number 4    Number of Visits 12    Date for OT Re-Evaluation 06/02/21   Every 10 visits   Authorization Type U[employer]    Authorization - Visit Number 4    Progress Note Due on Visit 10    OT Start Time 1013    OT Stop Time 1057    OT Time Calculation (min) 44 min    Activity Tolerance Patient tolerated treatment well;No increased pain    Behavior During Therapy WFL for tasks assessed/performed             Past Medical History:  Diagnosis Date   Closed hamate fracture     Past Surgical History:  Procedure Laterality Date   CLOSED REDUCTION METACARPAL WITH PERCUTANEOUS PINNING Right 03/23/2021   Procedure: CLOSED REDUCTION HAMATE WITH PERCUTANEOUS PINNING;  Surgeon: Marlyne Beards, MD;  Location: [facility];  Service: Orthopedics;  Laterality: Right;   NO PAST SURGERIES      There were no vitals filed for this visit.   Subjective Assessment - 05/24/21 1015     Subjective  Pt states "It's moving better"    Pertinent History Non significant PMH per pt report    Patient Stated Goals Get hand use back    Currently in Pain? No/denies    Pain Score 0-No pain    Multiple Pain Sites No                [facility] OT Assessment - 05/24/21 0001       Right Hand AROM   R Ring  MCP 0-90 84 Degrees    R Ring PIP 0-100 90 Degrees   -10* extension   R Ring DIP 0-70 64 Degrees    R Little  MCP 0-90 74 Degrees    R Little PIP 0-100 74 Degrees   -8* extension   R Little DIP 0-70 62 Degrees                OT Treatments/Exercises (OP) - 05/24/21 0001       ADLs   ADL Comments Begin d/c of splint.  Pt to wear splint during heavier activity at home or work only, he will remove for HEP, gentle A/ROM, A/AROM and P/ROM R hand. Pt may reapply splint after ex's PRN or leave off when at rest at home and with light activity - bathing, dressing, folding laundry, eating etc...   Pt verbalized understanding of htis in the clinic today.     Exercises   Exercises Hand      Hand Exercises   PIPJ Flexion Right;10 reps;AAROM    PIPJ Extension AAROM;Right;10 reps    DIPJ Flexion AAROM;Right;10 reps    DIPJ Extension AAROM;Right;10 reps    Other Hand Exercises Verbal review and performance of HEP to include removing splint and performing tendon gliding ex's (finger extension, MCP flexion with IP extension - lumbrical stretch, hook, composite and flat fist. Pt performed in clinic    Other Hand Exercises Upgraded HEP to include active wrist flexion, extension, RD/UD, composite finger extension, prayer stretch and wall stretch (to encourage increased finger extension right RF/Small). Pt reports  improved finger extension after performance today.      Splinting   Splinting Pt was educated to begin d/c splint use and only use with heavy activity. He will remove for light activity, HEP and at rest to encourage increased flexibility at wrist, had and fingers right UE.      Manual Therapy   Manual Therapy Edema management;Other (comment)   Gentle retrograde massage right volar/dorsal hand x12 min.   Other Manual Therapy Gentle joint mobilization/PROM for finger extension, composte finger extension per therapist. Pt ed to also perfom at home. He verbalized understanding.                OT Education - 05/24/21 1321     Education Details Upgraded HEP, edema control, splint wear and care during only heavy activity at this time PRN. Pt will remove for HEP and during light functional activity R hand/wrist.    Person(s) Educated Patient    Methods Explanation;Demonstration    Comprehension Verbalized  understanding;Need further instruction              OT Short Term Goals - 05/17/21 1412       OT SHORT TERM GOAL #1   Title Pt will be Mod I splinting use, care and precautions R UE    Time 4    Period Weeks    Status Achieved    Target Date 05/26/21      OT SHORT TERM GOAL #2   Title Pt will be Mod I initial HEP for R dominant hand    Time 4    Period Weeks    Status On-going    Target Date 05/26/21      OT SHORT TERM GOAL #3   Title Pt will be Mod I edema control techniques R UE as seen by independent ability to state in clinic    Time 4    Period Weeks    Status Achieved    Target Date 05/26/21               OT Long Term Goals - 04/28/21 1625       OT LONG TERM GOAL #1   Title Pt will be Mod I updated HEP R hand    Time 8    Period Weeks    Status New    Target Date 06/23/21      OT LONG TERM GOAL #2   Title Pt will be Mod I making a flat fist with his R hand in preparation for return to home nad work related activity    Time 8    Period Weeks    Status New    Target Date 06/23/21      OT LONG TERM GOAL #3   Title Pt will reports pain in right hand as 3/10 or less during functional activity and ADL's    Time 8    Period Weeks    Status New    Target Date 06/23/21      OT LONG TERM GOAL #4   Title Pt will report increased strength as seen by 10# or greater grip as seen by JAMAR dynamometer grip in preparation for return to work related activity    Time 8    Period Weeks    Status New    Target Date 06/23/21      OT LONG TERM GOAL #5   Title Update and progress all goals as appropriate  Plan - 05/24/21 1323     Clinical Impression Statement Pt is currently 9 weeks post op today surgical repair to communitute right hammate fx with dislocation of 4th and 5th carpometacarpal joints. His HEP was upgraded today to inculde active wrist ex's and finger composite extension in addition to his previous HEP. He will begin to  d/c splint use right especially during ADL's, and light functional activity at home. Nice gains noted in A/ROM R 4th and 5th digits.    OT Occupational Profile and History Problem Focused Assessment - Including review of records relating to presenting problem    Occupational performance deficits (Please refer to evaluation for details): ADL's;Work    Body Structure / Function / Physical Skills ADL;Strength;Dexterity;Pain;Edema;UE functional use;ROM;Coordination;Flexibility;Mobility;Decreased knowledge of precautions;FMC    Rehab Potential Good    Clinical Decision Making Limited treatment options, no task modification necessary    Comorbidities Affecting Occupational Performance: None    Modification or Assistance to Complete Evaluation  No modification of tasks or assist necessary to complete eval    OT Frequency Other (comment)   1-2x/week for 12 visits   OT Duration 12 weeks    OT Treatment/Interventions Self-care/ADL training;Moist Heat;Fluidtherapy;Splinting;Therapeutic activities;Therapeutic exercise;Cryotherapy;Passive range of motion;Paraffin;Manual Therapy;Patient/family education    Plan Check HEP, assess goals, edema control right hand, consider A/ROM Assessment R hand/wrist.    Consulted and Agree with Plan of Care Patient             Patient will benefit from skilled therapeutic intervention in order to improve the following deficits and impairments:   Body Structure / Function / Physical Skills: ADL, Strength, Dexterity, Pain, Edema, UE functional use, ROM, Coordination, Flexibility, Mobility, Decreased knowledge of precautions, FMC     Flexion (Active With Finger Extension)    Let hand dangle over edge of table, palm down. Bend wrist downward as far as possible. Hold __10__ seconds. Repeat _10___ times. Do __4-6_ sessions per day.  Extension (Active With Finger Extension)    With forearm on table and wrist over edge, lift hand with fingers straight. Hold __10__  seconds. Repeat __10__ times. Do _4-6__ sessions per day.  FINGERS: Extension    Use opposite hand to straighten fingers. Hold _10__ seconds. _10_ reps per set, _4-6_ sets per day You can do this at the wall - wall stretch or as a prayer stretch bt putting yur hands together and stretching/straightening your fingers.  Ulnar Deviation (Passive)    With palm and wrist on table, place other hand on top to steady while bringing elbow outward. Hold __10__ seconds. Repeat _10___ times. Do _4-6__ sessions per day.   Radial Deviation (Passive)    With wrist and palm on table, place other hand on top to keep steady while bringing elbow inward. Hold _10___ seconds. Repeat _10___ times. Do __4-6__ sessions per day.    Flexor Tendon Gliding (Active Full Fist)    Straighten all fingers, then make a fist, bending all joints. Repeat __10__ times. Do __4-6__ sessions per day.  Flexor Tendon Gliding (Active Hook Fist)    With fingers and knuckles straight, bend middle and tip joints. Do not bend large knuckles. Repeat _10___ times. Do __4-6__ sessions per day.  Flexor Tendon Gliding (Active Straight Fist)    Start with fingers straight. Bend knuckles and middle joints. Keep fingertip joints straight to touch base of palm. Repeat _10___ times. Do __4-6_ sessions per day.              Visit Diagnosis:  Pain in right hand  Muscle weakness (generalized)  Stiffness of right hand, not elsewhere classified  Other lack of coordination  Localized edema    Problem List Patient Active Problem List   Diagnosis Date Noted   Closed dislocation of fourth carpometacarpal joint of right hand    Closed dislocation of fifth carpometacarpal joint of right hand    Closed displaced fracture of body of right hamate bone 03/17/2021    Alm Bustard, OT/L 05/24/2021, 1:29 PM  [facility] Physical Therapy [address] [place], [place], [address] Phone:  [phone]   Fax:  [phone]  Name: Nathaniel Jordan MRN: [id number] Date of Birth: 12/06/1990

## 2021-05-31 ENCOUNTER — Ambulatory Visit (INDEPENDENT_AMBULATORY_CARE_PROVIDER_SITE_OTHER): Payer: 59 | Admitting: Orthopedic Surgery

## 2021-05-31 ENCOUNTER — Encounter: Payer: Self-pay | Admitting: Occupational Therapy

## 2021-05-31 ENCOUNTER — Other Ambulatory Visit: Payer: Self-pay

## 2021-05-31 ENCOUNTER — Ambulatory Visit (INDEPENDENT_AMBULATORY_CARE_PROVIDER_SITE_OTHER): Payer: 59 | Admitting: Occupational Therapy

## 2021-05-31 ENCOUNTER — Ambulatory Visit (INDEPENDENT_AMBULATORY_CARE_PROVIDER_SITE_OTHER): Payer: 59

## 2021-05-31 DIAGNOSIS — M6281 Muscle weakness (generalized): Secondary | ICD-10-CM | POA: Diagnosis not present

## 2021-05-31 DIAGNOSIS — S62141A Displaced fracture of body of hamate [unciform] bone, right wrist, initial encounter for closed fracture: Secondary | ICD-10-CM

## 2021-05-31 DIAGNOSIS — R600 Localized edema: Secondary | ICD-10-CM

## 2021-05-31 DIAGNOSIS — M25641 Stiffness of right hand, not elsewhere classified: Secondary | ICD-10-CM | POA: Diagnosis not present

## 2021-05-31 DIAGNOSIS — R278 Other lack of coordination: Secondary | ICD-10-CM | POA: Diagnosis not present

## 2021-05-31 DIAGNOSIS — M79641 Pain in right hand: Secondary | ICD-10-CM

## 2021-05-31 NOTE — Progress Notes (Signed)
   Post-Op Visit Note   Patient: Nathaniel Jordan           Date of Birth: [birth year]           MRN: [id number] Visit Date: 05/31/2021 PCP: Patient, No Pcp Per (Inactive)   Assessment & Plan:  Chief Complaint:  Chief Complaint  Patient presents with   Right Wrist - Fracture, Follow-up   Visit Diagnoses:  1. Closed displaced fracture of body of hamate of right wrist, initial encounter     Plan: He is progressing well postoperatively.  He is making good progress with range of motion.  Now lacks approximately 1 cm from the distal palmar crease with small finger.  No swelling.  No erythema.  Well healed pin sites.  No TTP along 4th or 5th MC. No pain w/ motion at 4th or 4th [facility] articulation.   Follow-Up Instructions: No follow-ups on file.   Orders:  Orders Placed This Encounter  Procedures   XR Wrist Complete Right   No orders of the defined types were placed in this encounter.   Imaging: 3V of the R hand taken today are reviewed and interpreted by me.  They demonstrate well aligned 4th and 5th CMC articulations.  There is some osteolysis at the 4th and 5th MC in the area of the pin sites.  Likely related to the previously placed k-wires.   PMFS History: Patient Active Problem List   Diagnosis Date Noted   Closed dislocation of fourth carpometacarpal joint of right hand    Closed dislocation of fifth carpometacarpal joint of right hand    Closed displaced fracture of body of right hamate bone 03/17/2021   Past Medical History:  Diagnosis Date   Closed hamate fracture     No family history on file.  Past Surgical History:  Procedure Laterality Date   CLOSED REDUCTION METACARPAL WITH PERCUTANEOUS PINNING Right 03/23/2021   Procedure: CLOSED REDUCTION HAMATE WITH PERCUTANEOUS PINNING;  Surgeon: Marlyne Beards, MD;  Location: [facility];  Service: Orthopedics;  Laterality: Right;   NO PAST SURGERIES     Social History   Occupational History   Not on  file  Tobacco Use   Smoking status: Some Days    Packs/day: 0.50    Years: 3.00    Pack years: 1.50    Types: Cigarettes   Smokeless tobacco: Never  Vaping Use   Vaping Use: Every day  Substance and Sexual Activity   Alcohol use: Yes    Comment: Pt stated that he drinks beer on occasion   Drug use: Not Currently    Types: Marijuana    Comment: not since 2020   Sexual activity: Yes

## 2021-05-31 NOTE — Therapy (Signed)
[facility] Physical Therapy [address] [place], [place], [address] Phone: [phone]   Fax:  [phone]  Occupational Therapy Treatment  Patient Details  Name: DAVIDMICHAEL ZARAZUA MRN: [id number] Date of Birth: Jun 10, 1991 Referring Provider (OT): Dr Frazier Butt   Encounter Date: 05/31/2021   OT End of Session - 05/31/21 1418     Visit Number 5    Number of Visits 12    Date for OT Re-Evaluation 06/30/21   Every 10 visits   Authorization Type UHC    Authorization - Visit Number 5    Progress Note Due on Visit 10    OT Start Time 1015    OT Stop Time 1100    OT Time Calculation (min) 45 min    Activity Tolerance Patient tolerated treatment well;No increased pain    Behavior During Therapy WFL for tasks assessed/performed             Past Medical History:  Diagnosis Date   Closed hamate fracture     Past Surgical History:  Procedure Laterality Date   CLOSED REDUCTION METACARPAL WITH PERCUTANEOUS PINNING Right 03/23/2021   Procedure: CLOSED REDUCTION HAMATE WITH PERCUTANEOUS PINNING;  Surgeon: Marlyne Beards, MD;  Location: [facility];  Service: Orthopedics;  Laterality: Right;   NO PAST SURGERIES      There were no vitals filed for this visit.   Subjective Assessment - 05/31/21 1018     Subjective  Pt reports that he is weaning from splint at this time and that his A/ROM is improving for grip on the right.    Pertinent History Non significant PMH per pt report    Patient Stated Goals Get hand use back    Currently in Pain? No/denies    Pain Score 0-No pain    Multiple Pain Sites No                [facility] OT Assessment - 05/31/21 0001       ROM / Strength   AROM / PROM / Strength AROM      AROM   Overall AROM  --   Pt has composite grip with end range small finger DIP flexion ~1cm from [facility]   AROM Assessment Site Wrist;Finger    Right/Left Wrist Right    Right Wrist Extension 35 Degrees   passive 50   Right Wrist  Flexion 52 Degrees   passive 56   Right Wrist Radial Deviation 22 Degrees    Right Wrist Ulnar Deviation 16 Degrees      Right Hand AROM   R Little  MCP 0-90 80 Degrees    R Little PIP 0-100 82 Degrees    R Little DIP 0-70 72 Degrees      Hand Function   Right Hand Grip (lbs) 20.7   vs left = 96.3#   Right Hand Lateral Pinch 9 lbs   vs left = 22#   Right Hand 3 Point Pinch 8.5 lbs   vs left = 20.5               OT Treatments/Exercises (OP) - 05/31/21 0001       ADLs   ADL Comments Pt to continue using R hand for daily activity and functional use. He denies pain and is with Mod I all ADL's. Wearing splint only during heavier activity at home, otherwise is without it.      Exercises   Exercises Hand      Hand Exercises  PIPJ Flexion Right;10 reps;AAROM;PROM;AROM    PIPJ Extension AAROM;Right;10 reps;PROM;AROM    DIPJ Flexion AAROM;Right;10 reps;PROM;AROM    DIPJ Extension AAROM;Right;10 reps;PROM;AROM    Other Hand Exercises Verbal review and performance of HEP to include removing splint and performing tendon gliding ex's (finger extension, MCP flexion with IP extension - lumbrical stretch, hook, composite and flat fist. Pt performed in clinic    Other Hand Exercises Upgraded HEP to include active and A/AROM for wrist flexion/extension, RD/UD as well as strengthening ex's with putty for grip, 3 point and lateral pinches. Pt will decrease overall splint use at this time as he is now 10 weeks post-op today.   Pt verbalized understanding of this in the clinic today.     Splinting   Splinting Decrease splint use at home and in public. D/c use during ADL's and homemaking and caring for children etc...   Stress A/ROM for wrist and HEP for flexibility and strengthening                   OT Education - 05/31/21 1417     Education Details Upgraded HEP for red putty for ggrip, pinches, d/c splint use at home and during functional activity, edema control, splint wear and  care during only heavy activity at this time PRN. A/PROM for wrist flexion/extension and RD/UD.    Person(s) Educated Patient    Methods Explanation;Demonstration    Comprehension Verbalized understanding;Need further instruction              OT Short Term Goals - 05/31/21 1422       OT SHORT TERM GOAL #1   Title Pt will be Mod I splinting use, care and precautions R UE    Time 4    Period Weeks    Target Date 05/26/21      OT SHORT TERM GOAL #2   Title Pt will be Mod I initial HEP for R dominant hand    Time 4    Period Weeks    Status Achieved    Target Date 05/26/21      OT SHORT TERM GOAL #3   Title Pt will be Mod I edema control techniques R UE as seen by independent ability to state in clinic    Time 4    Period Weeks    Status Achieved    Target Date 05/26/21               OT Long Term Goals - 04/28/21 1625       OT LONG TERM GOAL #1   Title Pt will be Mod I updated HEP R hand    Time 8    Period Weeks    Status New    Target Date 06/23/21      OT LONG TERM GOAL #2   Title Pt will be Mod I making a flat fist with his R hand in preparation for return to home nad work related activity    Time 8    Period Weeks    Status New    Target Date 06/23/21      OT LONG TERM GOAL #3   Title Pt will reports pain in right hand as 3/10 or less during functional activity and ADL's    Time 8    Period Weeks    Status New    Target Date 06/23/21      OT LONG TERM GOAL #4   Title Pt will report increased strength as  seen by 10# or greater grip as seen by JAMAR dynamometer grip in preparation for return to work related activity    Time 8    Period Weeks    Status New    Target Date 06/23/21      OT LONG TERM GOAL #5   Title Update and progress all goals as appropriate                   Plan - 05/31/21 1419     Clinical Impression Statement Pt is currently 10 weeks post-op today. Pt was reassessed for A/ROM and strength. He should benefit from  strengthening with putty, decreased splint use and upgraded HEP for wrist and hand as instructed today in the clinic.    OT Occupational Profile and History Problem Focused Assessment - Including review of records relating to presenting problem    Occupational performance deficits (Please refer to evaluation for details): ADL's;Work    Body Structure / Function / Physical Skills ADL;Strength;Dexterity;Pain;Edema;UE functional use;ROM;Coordination;Flexibility;Mobility;Decreased knowledge of precautions;FMC    Rehab Potential Good    Clinical Decision Making Limited treatment options, no task modification necessary    Comorbidities Affecting Occupational Performance: None    Modification or Assistance to Complete Evaluation  No modification of tasks or assist necessary to complete eval    OT Frequency Other (comment)   1-2x/week for 12 visits   OT Duration 12 weeks    OT Treatment/Interventions Self-care/ADL training;Moist Heat;Fluidtherapy;Splinting;Therapeutic activities;Therapeutic exercise;Cryotherapy;Passive range of motion;Paraffin;Manual Therapy;Patient/family education    Plan Check upgraded HEP (Putty, wrist ROM and functional use), ?d/c splint R hand wrist.    Consulted and Agree with Plan of Care Patient             Patient will benefit from skilled therapeutic intervention in order to improve the following deficits and impairments:   Body Structure / Function / Physical Skills: ADL, Strength, Dexterity, Pain, Edema, UE functional use, ROM, Coordination, Flexibility, Mobility, Decreased knowledge of precautions, [facility]       Visit Diagnosis: Pain in right hand  Muscle weakness (generalized)  Stiffness of right hand, not elsewhere classified  Other lack of coordination  Localized edema    Problem List Patient Active Problem List   Diagnosis Date Noted   Closed dislocation of fourth carpometacarpal joint of right hand    Closed dislocation of fifth carpometacarpal joint  of right hand    Closed displaced fracture of body of right hamate bone 03/17/2021    Alm Bustard, OT/L 05/31/2021, 2:24 PM  [facility] Physical Therapy [address] [place], [place], [address] Phone: [phone]   Fax:  [phone]  Name: KALIF KATTNER MRN: [id number] Date of Birth: August 10, 1990

## 2021-05-31 NOTE — Patient Instructions (Signed)
Grip Strengthening (Resistive Putty)    Squeeze putty using thumb and all fingers. Repeat _10-20_ times. Do _1-2_ sessions per day.  Lateral Pinch Strengthening (Resistive Putty)    Squeeze between thumb and side of each finger in turn. Repeat __10__ times. Do __1_ sessions per day.  Three Jaw Chuck Pinch Strengthening (Resistive Putty)    Pull putty, using thumb, index and middle fingers. Repeat __10__ times. Do __1__ sessions per day.  Copyright  VHI. All rights reserved.

## 2021-06-09 ENCOUNTER — Ambulatory Visit (INDEPENDENT_AMBULATORY_CARE_PROVIDER_SITE_OTHER): Payer: 59 | Admitting: Occupational Therapy

## 2021-06-09 ENCOUNTER — Other Ambulatory Visit: Payer: Self-pay

## 2021-06-09 ENCOUNTER — Encounter: Payer: Self-pay | Admitting: Occupational Therapy

## 2021-06-09 DIAGNOSIS — M79641 Pain in right hand: Secondary | ICD-10-CM | POA: Diagnosis not present

## 2021-06-09 DIAGNOSIS — R600 Localized edema: Secondary | ICD-10-CM

## 2021-06-09 DIAGNOSIS — M25641 Stiffness of right hand, not elsewhere classified: Secondary | ICD-10-CM

## 2021-06-09 DIAGNOSIS — M6281 Muscle weakness (generalized): Secondary | ICD-10-CM

## 2021-06-09 DIAGNOSIS — R278 Other lack of coordination: Secondary | ICD-10-CM | POA: Diagnosis not present

## 2021-06-09 NOTE — Therapy (Signed)
[facility] Physical Therapy [address] [place], [place], [address] Phone: [phone]   Fax:  [phone]  Occupational Therapy Treatment & Discharge Summary  Patient Details  Name: Nathaniel Jordan MRN: [id number] Date of Birth: 1991-03-07 Referring Provider (OT): Dr Tempie Donning   Encounter Date: 06/09/2021   OT End of Session - 06/09/21 1353     Visit Number 6    Number of Visits 12    Date for OT Re-Evaluation 06/30/21   Every 10 visits   Authorization Type [employer]    Authorization - Visit Number 6    Progress Note Due on Visit 10    OT Start Time 0932    OT Stop Time 1009    OT Time Calculation (min) 37 min    Activity Tolerance Patient tolerated treatment well;No increased pain    Behavior During Therapy WFL for tasks assessed/performed             Past Medical History:  Diagnosis Date   Closed hamate fracture     Past Surgical History:  Procedure Laterality Date   CLOSED REDUCTION METACARPAL WITH PERCUTANEOUS PINNING Right 03/23/2021   Procedure: CLOSED REDUCTION HAMATE WITH PERCUTANEOUS PINNING;  Surgeon: Sherilyn Cooter, MD;  Location: [facility];  Service: Orthopedics;  Laterality: Right;   NO PAST SURGERIES      There were no vitals filed for this visit.   Subjective Assessment - 06/09/21 0940     Subjective  Pt reports Mod I HEP right hand. He reports using putty "three times a doy". He denies pain.    Pertinent History Non significant PMH per pt report    Patient Stated Goals Get hand use back    Currently in Pain? No/denies    Pain Score 0-No pain    Multiple Pain Sites No                [facility] OT Assessment - 06/09/21 0001       ROM / Strength   AROM / PROM / Strength AROM      AROM   AROM Assessment Site Wrist;Finger    Right/Left Wrist Right    Right Wrist Extension 55 Degrees   64 passively   Right Wrist Flexion 62 Degrees   74 passively   Right Wrist Radial Deviation 32 Degrees    Right Wrist  Ulnar Deviation 21 Degrees      Right Hand AROM   R Ring  MCP 0-90 88 Degrees    R Ring PIP 0-100 100 Degrees    R Ring DIP 0-70 81 Degrees    R Little  MCP 0-90 78 Degrees   84 passively   R Little PIP 0-100 95 Degrees    R Little DIP 0-70 84 Degrees   Pt is 1/2 cm from [facility] after treatment today     Hand Function   Right Hand Grip (lbs) 31.6    Right Hand Lateral Pinch 11.5 lbs    Right Hand 3 Point Pinch 10 lbs                OT Treatments/Exercises (OP) - 06/09/21 0001       ADLs   ADL Comments Pt reports that he is wearing the brace when goes to the store or using hand for lifting (ie. Taking out the trash etc). He has otherwise d/c the splint at this time. Discussed d/c from therapy and d/c splint at this time. He will continue HEP I'ly and plans  to f/u with Dr Tempie Donning next week.      Exercises   Exercises Hand      Hand Exercises   MCPJ Flexion AROM;PROM;Right;10 reps    MCPJ Extension PROM;AROM;Right;10 reps    PIPJ Flexion Right;10 reps;AAROM;PROM;AROM    PIPJ Extension AAROM;Right;10 reps;PROM;AROM    DIPJ Flexion AAROM;Right;10 reps;PROM;AROM    DIPJ Extension AAROM;Right;10 reps;PROM;AROM    Other Hand Exercises Verbal review and performance of HEP to include active and passive tendon gliding ex's, finger extension, MCP flexion with IP extension - lumbrical stretch, hook, composite and flat fist. Pt performed in clinic. He denies pain    Other Hand Exercises Verbal review of HEP for putty strengthening R hand.  Active, passive and A/AROM for wrist flexion/extension, RD/UD as well as strengthening ex's with putty for grip, 3 point and lateral pinches.      Splinting   Splinting D/C splint use at this time. Pt is currently 11 weeks and 2 days post-op. He continues to report some stiffness with active wrist extension and morning tightness in his right small MCP. Both of these tend to become more flexible as the day progresses and with performance of home program  per his report.      Manual Therapy   Manual Therapy Edema management;Other (comment)   Gentle retrograde massage right volar/dorsal hand x10 min.   Other Manual Therapy Gentle joint mobilization/PROM for wrist extension, composite finger flexion/extension per therapist. Pt is able to make a full composite fist with right hand after therapy. Pt right small finger is at [facility].                OT Education - 06/09/21 1352     Education Details Pt to d/c splint use at this time and continue with HEP I'ly at home. D/c at this time.    Person(s) Educated Patient    Methods Explanation;Demonstration    Comprehension Verbalized understanding              OT Short Term Goals - 05/31/21 1422       OT SHORT TERM GOAL #1   Title Pt will be Mod I splinting use, care and precautions R UE    Time 4    Period Weeks    Target Date 05/26/21      OT SHORT TERM GOAL #2   Title Pt will be Mod I initial HEP for R dominant hand    Time 4    Period Weeks    Status Achieved    Target Date 05/26/21      OT SHORT TERM GOAL #3   Title Pt will be Mod I edema control techniques R UE as seen by independent ability to state in clinic    Time 4    Period Weeks    Status Achieved    Target Date 05/26/21               OT Long Term Goals - 06/09/21 1356       OT LONG TERM GOAL #1   Title Pt will be Mod I updated HEP R hand    Time 8    Period Weeks    Status Achieved    Target Date 06/23/21      OT LONG TERM GOAL #2   Title Pt will be Mod I making a flat fist with his R hand in preparation for return to home and work related activity    Time 8  Period Weeks    Status Achieved   Note: Pt is able to make a full fist R sm/ring fingers are at [facility]   Target Date 06/23/21      OT LONG TERM GOAL #3   Title Pt will reports pain in right hand as 3/10 or less during functional activity and ADL's    Time 8    Period Weeks    Status Achieved    Target Date 06/23/21      OT LONG TERM  GOAL #4   Title Pt will report increased strength as seen by 10# or greater grip as seen by JAMAR dynamometer grip in preparation for return to work related activity    Time 8    Period Weeks    Status Achieved    Target Date 06/23/21                Plan - 06/09/21 1354     Clinical Impression Statement Pt is currently 11 weeks and 2 days post today. He is indepdent with his HEP, AROM and strength continue to improve weekly and he denies pain. Pt was educated to d/c splint use R hand and continue with HEP I'ly. He has met 4/4 LTG's and is currently able to fully extend his fingers and make a full composite fist with R small/ring fingers to his [facility]. He has a follow up appointment with Dr Tempie Donning next week Will d/c from out-pt OT at this time.    OT Occupational Profile and History Problem Focused Assessment - Including review of records relating to presenting problem    Occupational performance deficits (Please refer to evaluation for details): ADL's;Work    Body Structure / Function / Physical Skills ADL;Strength;Dexterity;Pain;Edema;UE functional use;ROM;Coordination;Flexibility;Mobility;Decreased knowledge of precautions;FMC    Rehab Potential Good    Clinical Decision Making Limited treatment options, no task modification necessary    Comorbidities Affecting Occupational Performance: None    Modification or Assistance to Complete Evaluation  No modification of tasks or assist necessary to complete eval    OT Frequency Other (comment)   1-2x/week   OT Duration 12 weeks    OT Treatment/Interventions Self-care/ADL training;Moist Heat;Fluidtherapy;Splinting;Therapeutic activities;Therapeutic exercise;Cryotherapy;Passive range of motion;Paraffin;Manual Therapy;Patient/family education    Plan D/C from therapy at this time. Pt to cont HEP I'ly and f/u with MD next week. Will sign off    Consulted and Agree with Plan of Care Patient             Patient will benefit from skilled  therapeutic intervention in order to improve the following deficits and impairments:   Body Structure / Function / Physical Skills: ADL, Strength, Dexterity, Pain, Edema, UE functional use, ROM, Coordination, Flexibility, Mobility, Decreased knowledge of precautions, [facility]       Visit Diagnosis: Pain in right hand  Muscle weakness (generalized)  Stiffness of right hand, not elsewhere classified  Other lack of coordination  Localized edema    Problem List Patient Active Problem List   Diagnosis Date Noted   Closed dislocation of fourth carpometacarpal joint of right hand    Closed dislocation of fifth carpometacarpal joint of right hand    Closed displaced fracture of body of right hamate bone 03/17/2021    Almyra Deforest, OT/L 06/09/2021, 2:02 PM  [facility] Physical Therapy [address] [place], [place], [address] Phone: [phone]   Fax:  [phone]  Name: Nathaniel Jordan MRN: [id number] Date of Birth: 04-26-1991

## 2021-06-14 ENCOUNTER — Encounter: Payer: 59 | Admitting: Occupational Therapy

## 2021-06-14 ENCOUNTER — Other Ambulatory Visit: Payer: Self-pay

## 2021-06-14 ENCOUNTER — Ambulatory Visit (INDEPENDENT_AMBULATORY_CARE_PROVIDER_SITE_OTHER): Payer: 59 | Admitting: Orthopedic Surgery

## 2021-06-14 DIAGNOSIS — S63054A Dislocation of other carpometacarpal joint of right hand, initial encounter: Secondary | ICD-10-CM

## 2021-06-14 NOTE — Progress Notes (Signed)
   Post-Op Visit Note   Patient: Nathaniel Jordan           Date of Birth: January 27, 1991           MRN: [id number] Visit Date: 06/14/2021 PCP: Patient, No Pcp Per (Inactive)   Assessment & Plan:  Chief Complaint:  Chief Complaint  Patient presents with   Right Wrist - Follow-up    Stiffness in the wrist with bending, and trouble with lifting heavy things   Visit Diagnoses: No diagnosis found.  Plan: Patient continues to do well.  Minimal pain in hand.  No pain w/ palpation of 4th or 5th metacarpals.  No pain w/ motion at the 5th [facility] joint.  Able to make near complete fist but lacks 1-2 cm from [facility] with small finger secondary to MP stiffness.  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  He can return to work on Monday doing light duty.    Follow-Up Instructions: No follow-ups on file.   Orders:  No orders of the defined types were placed in this encounter.  No orders of the defined types were placed in this encounter.   Imaging: No results found.  PMFS History: Patient Active Problem List   Diagnosis Date Noted   Closed dislocation of fourth carpometacarpal joint of right hand    Closed dislocation of fifth carpometacarpal joint of right hand    Closed displaced fracture of body of right hamate bone 03/17/2021   Past Medical History:  Diagnosis Date   Closed hamate fracture     No family history on file.  Past Surgical History:  Procedure Laterality Date   CLOSED REDUCTION METACARPAL WITH PERCUTANEOUS PINNING Right 03/23/2021   Procedure: CLOSED REDUCTION HAMATE WITH PERCUTANEOUS PINNING;  Surgeon: Marlyne Beards, MD;  Location: [facility];  Service: Orthopedics;  Laterality: Right;   NO PAST SURGERIES     Social History   Occupational History   Not on file  Tobacco Use   Smoking status: Some Days    Packs/day: 0.50    Years: 3.00    Pack years: 1.50    Types: Cigarettes   Smokeless tobacco: Never  Vaping Use    Vaping Use: Every day  Substance and Sexual Activity   Alcohol use: Yes    Comment: Pt stated that he drinks beer on occasion   Drug use: Not Currently    Types: Marijuana    Comment: not since 2020   Sexual activity: Yes

## 2021-07-18 ENCOUNTER — Telehealth: Payer: Self-pay | Admitting: Orthopedic Surgery

## 2021-07-18 NOTE — Telephone Encounter (Signed)
Pt came up to office, wondering when he can be released off light duty? He wants to know if he can have a note faxed over to his job?  CB [phone]  Fax# [phone]

## 2021-07-19 NOTE — Telephone Encounter (Signed)
Can patient return to light duty work

## 2022-12-23 IMAGING — DX DG HAND COMPLETE 3+V*R*
3 series · 3 of 3 positions shown · non-contrast
Comparison: None.

CLINICAL DATA: RIGHT hand pain after punching a wall last night

EXAM:
RIGHT HAND - COMPLETE 3+ VIEW

[hand pa]
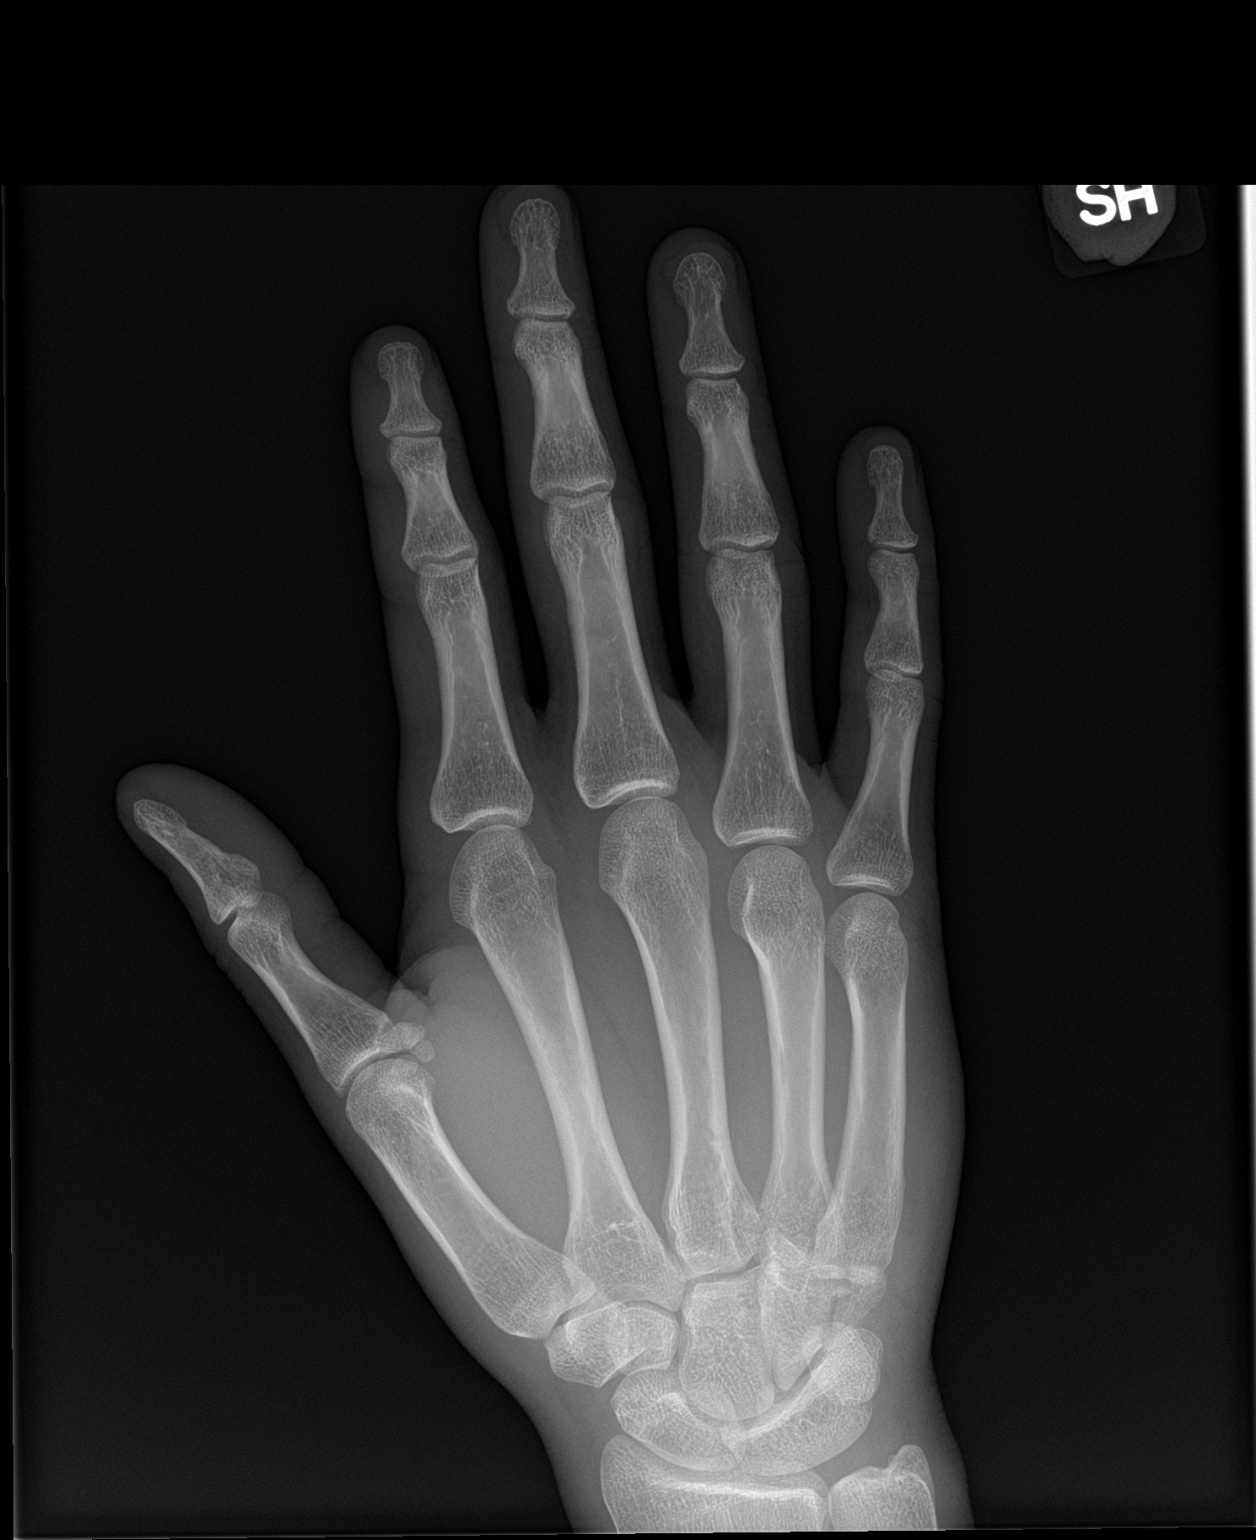

[hand obl]
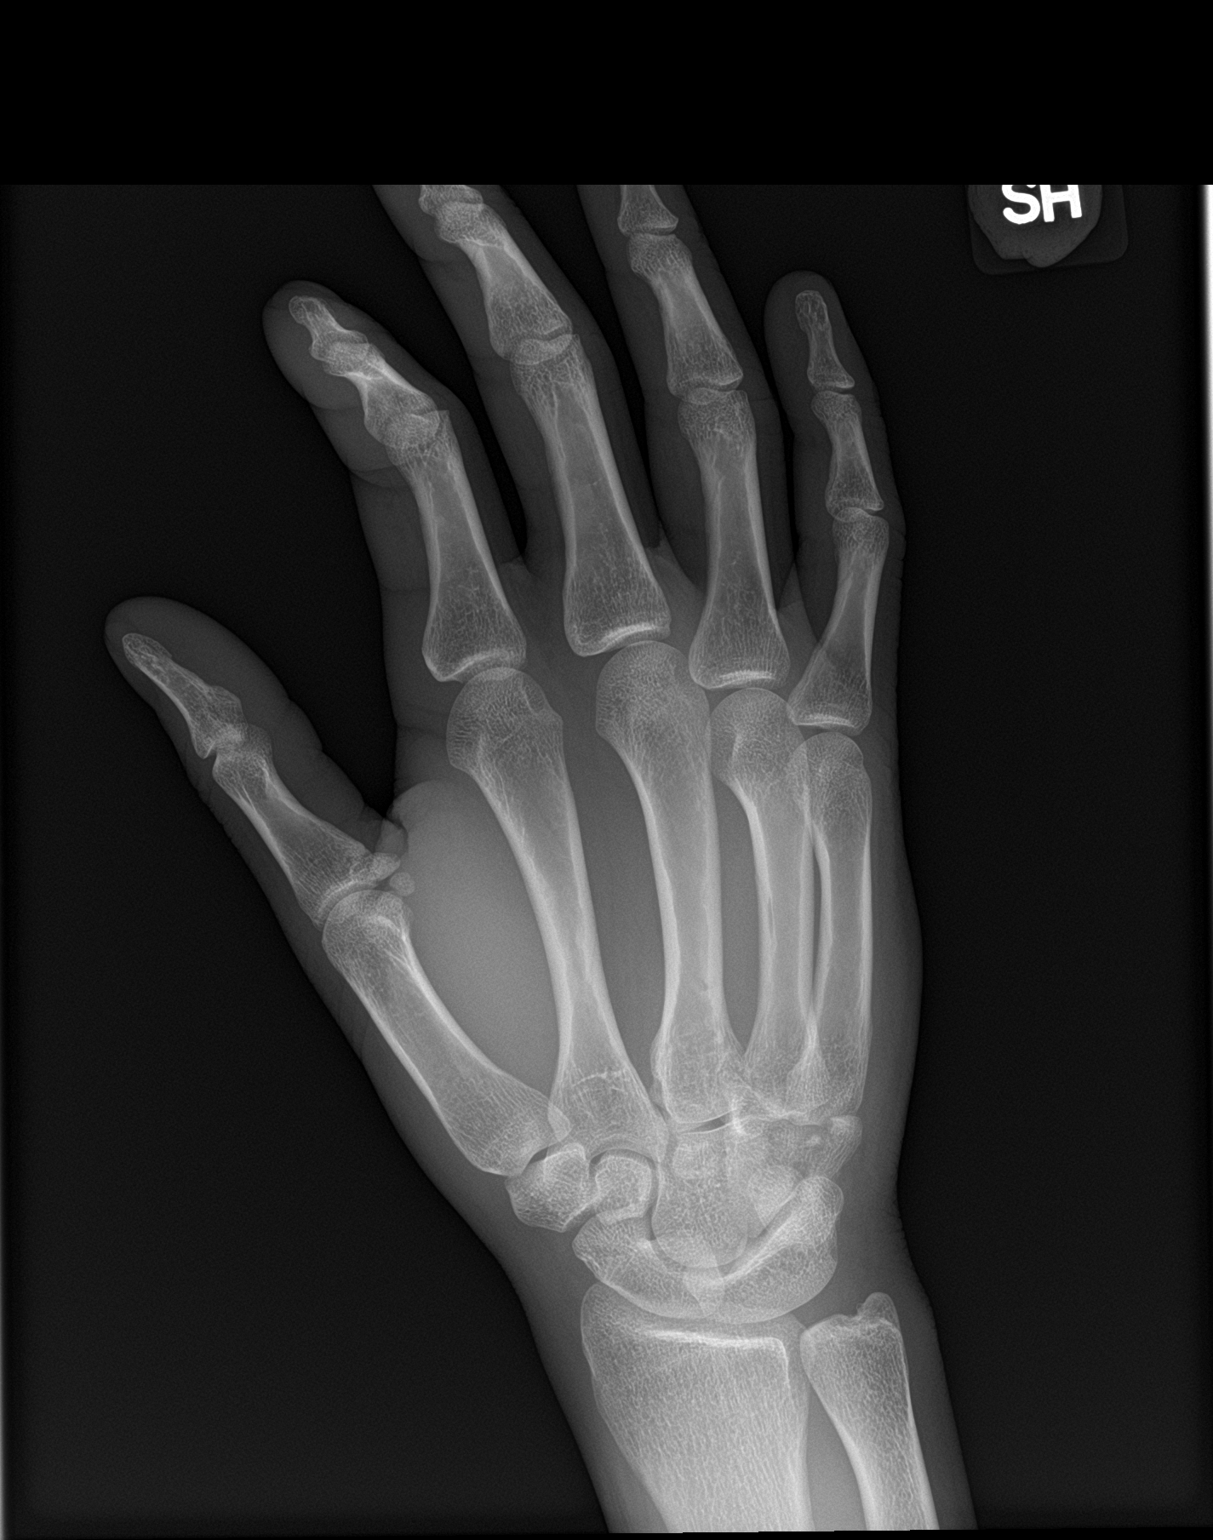

[hand lat]
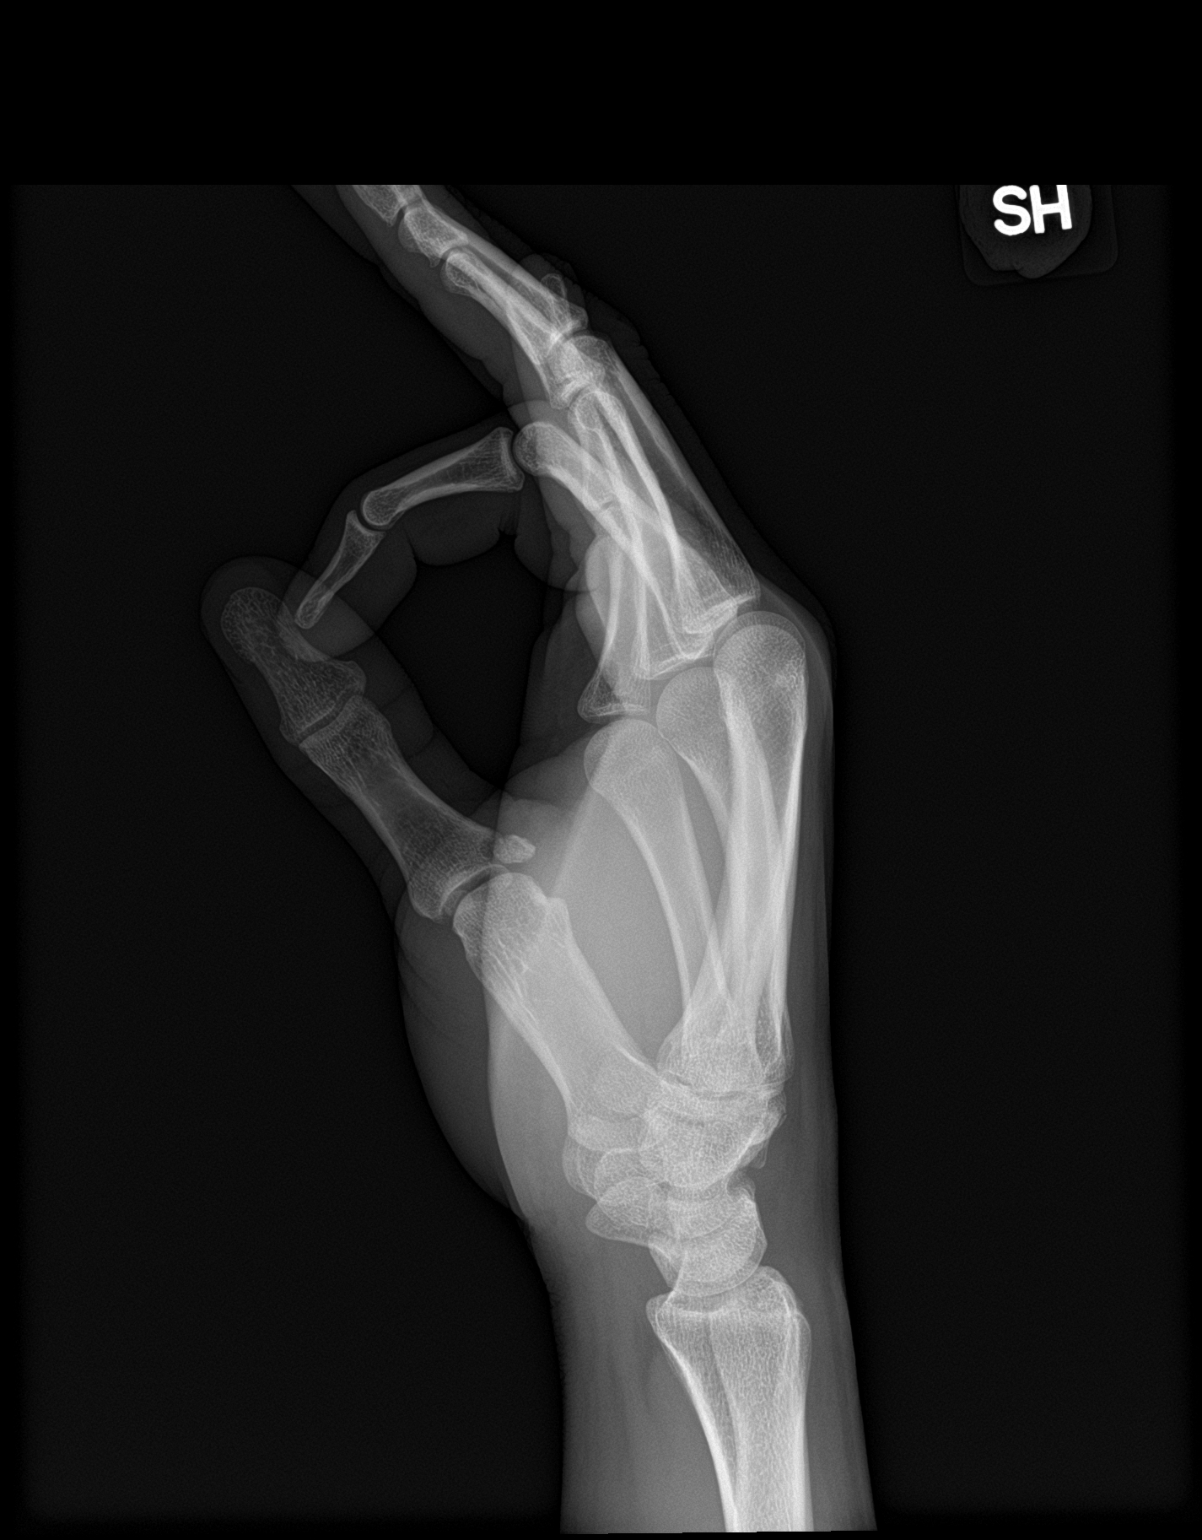

[3 of 3 positions shown; findings below may reference images not displayed]

FINDINGS: Osseous mineralization normal.

Congenital lunatotriquetral fusion.

Remaining joint spaces preserved.

Longitudinal displaced fracture of the hamate, minimally distracted,
extending into the fifth CMC joint and triquetrohamate joint.

No additional fracture, dislocation, or bone destruction.
IMPRESSION: Longitudinal mildly displaced intra-articular hamate fracture.

## 2022-12-23 IMAGING — CT CT HAND*R* W/O CM
3 series · 9 of 33 positions shown, 11 images · non-contrast
Comparison: None.

CLINICAL DATA: Fracture, hand

EXAM:
CT OF THE RIGHT HAND WITHOUT CONTRAST
TECHNIQUE: Multidetector CT imaging of the right hand was performed according
to the standard protocol. Multiplanar CT image reconstructions were
also generated.

[Series 5: axial st · axial · 0.23mm/px · z∈[-1138,-1138]mm · 1 of 227 slices shown, 2 images]
[im 122/227  soft-tissue]
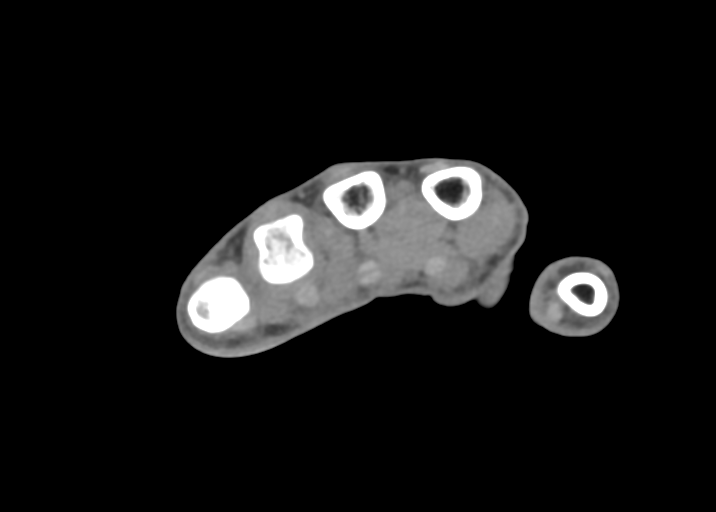
[im 122/227  bone]
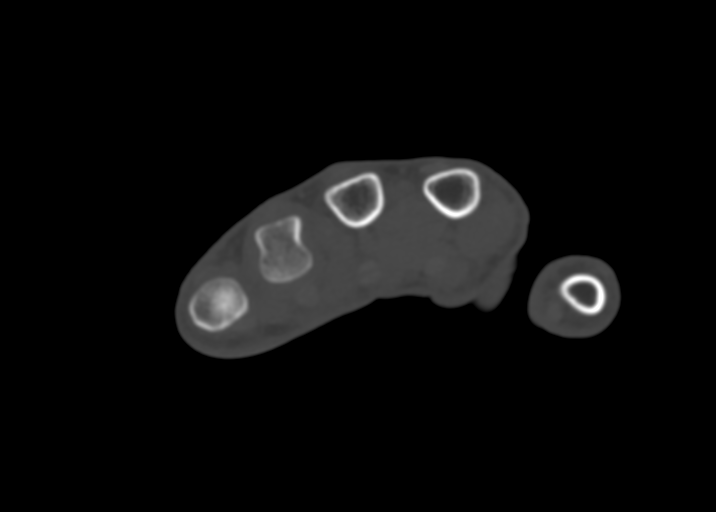

[Series 9: sag st · sagittal · 0.23mm/px · 5 of 131 slices shown, 6 images]
[im 44/131  bone]
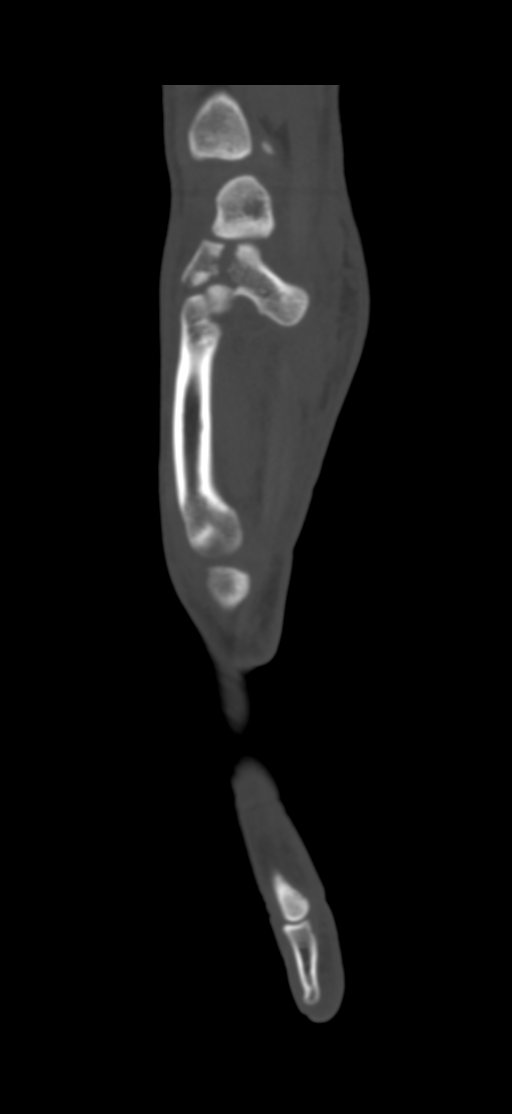
[im 55/131  bone]
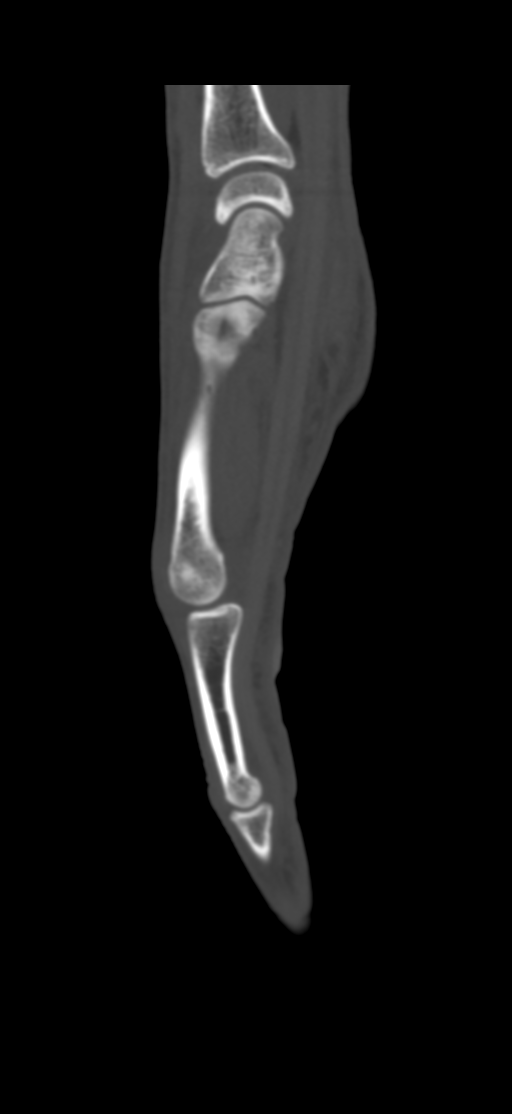
[im 66/131  soft-tissue]
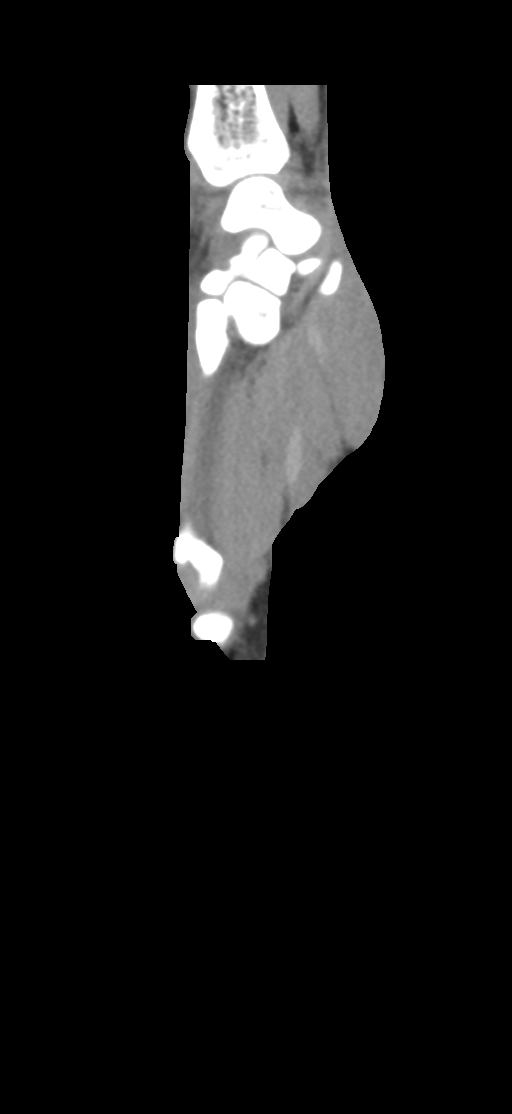
[im 66/131  bone]
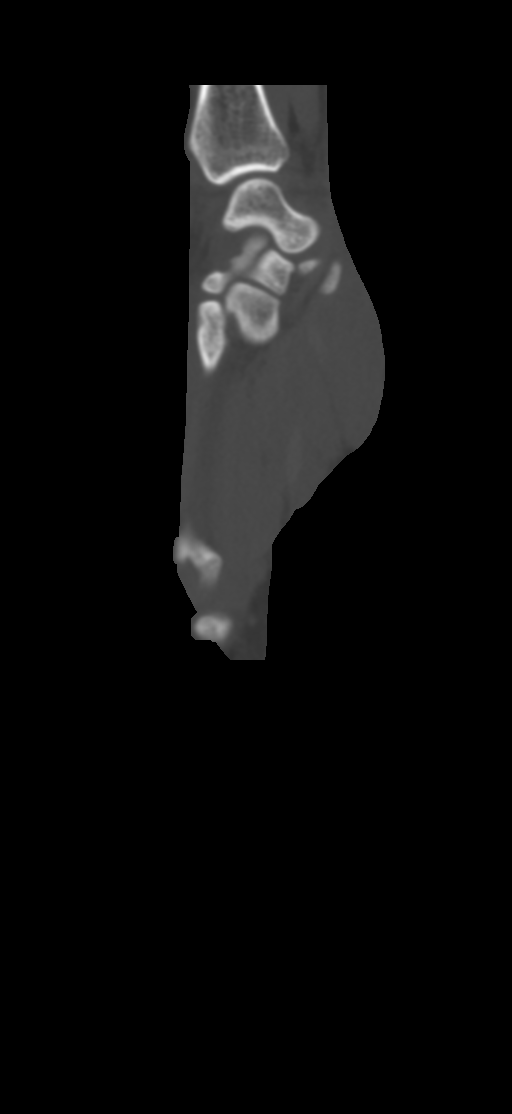
[im 76/131  bone]
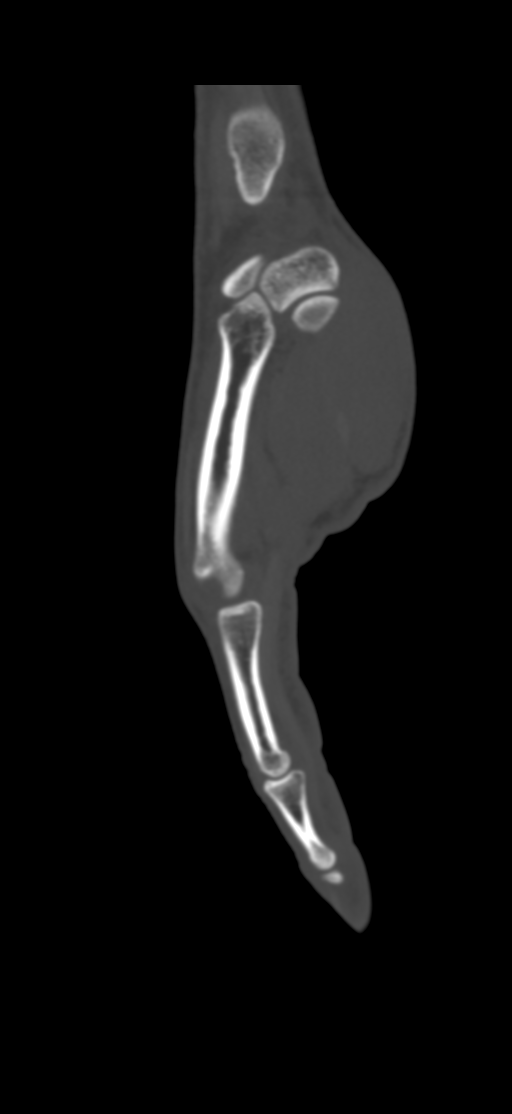
[im 87/131  bone]
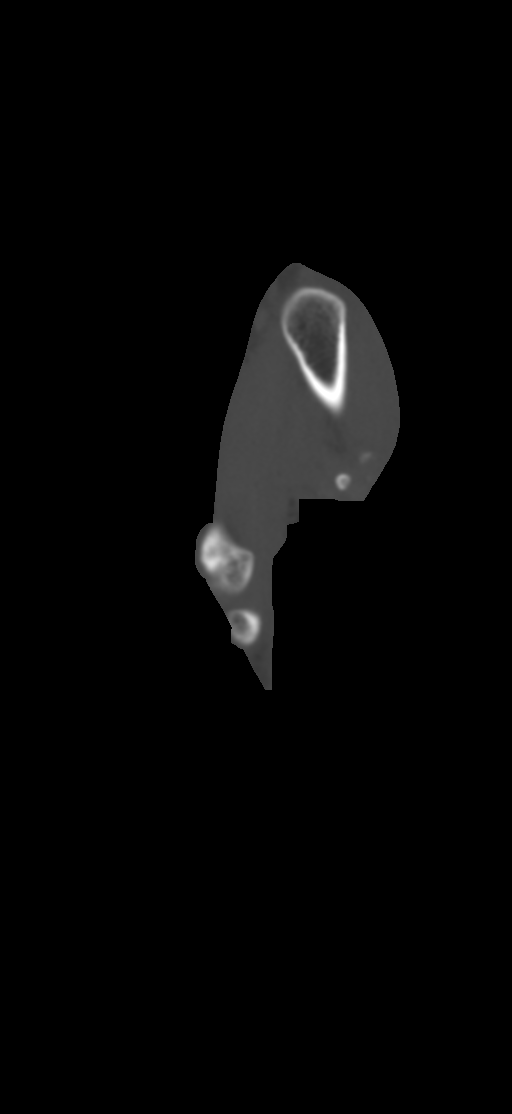

[Series 11: cor st · coronal · 0.36mm/px · 3 of 75 slices shown]
[im 15/75  bone]
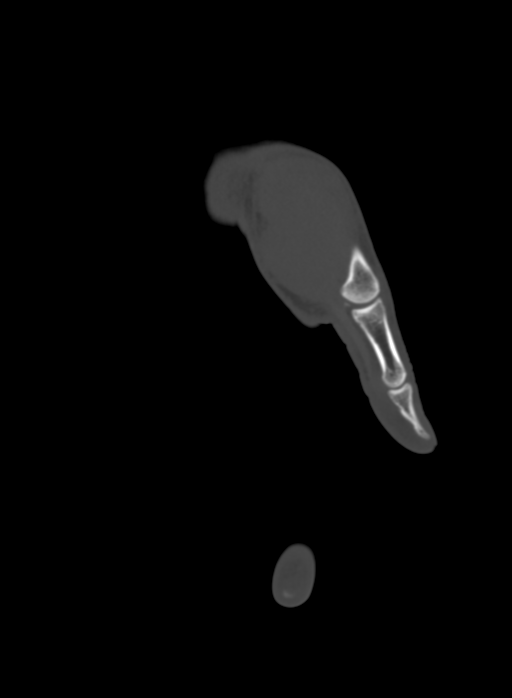
[im 30/75  bone]
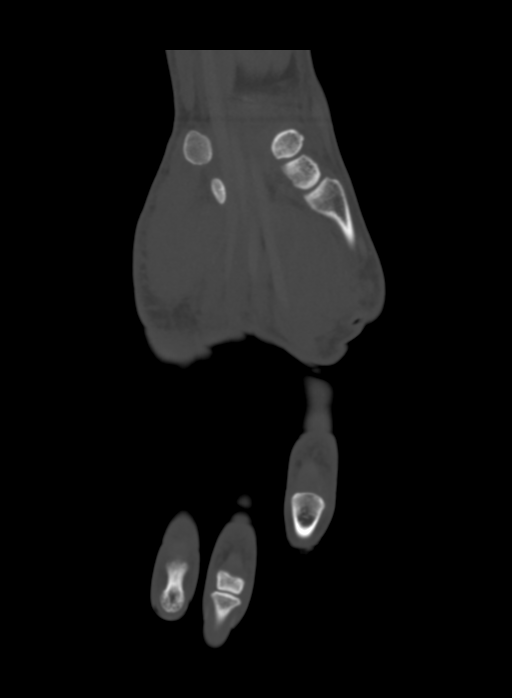
[im 45/75  bone]
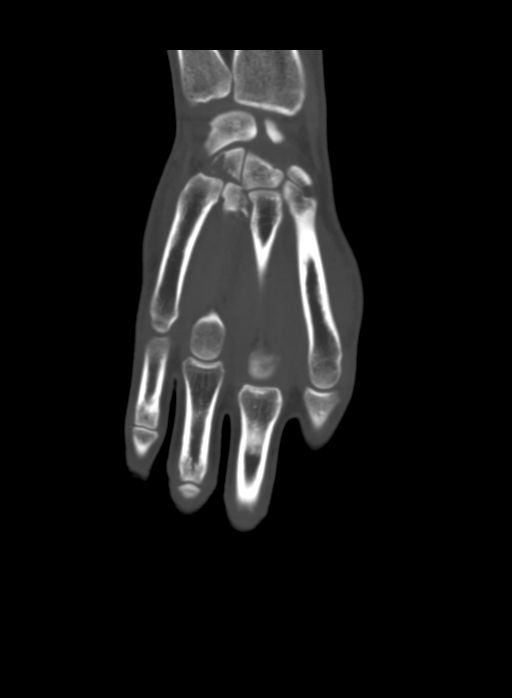

[9 of 33 positions shown; findings below may reference images not displayed]

FINDINGS: Bones/Joint/Cartilage

There is a comminuted and mildly displaced intra-articular fracture
of the volar aspect of the base of the fourth metacarpal. There is a
comminuted intra-articular hamate fracture with multiple mildly
displaced fragments dorsally up to 6 mm. There is mild dorsal
subluxation of the fourth and fifth metacarpals from the
carpometacarpal joint.

There is no significant arthritis.  Lunotriquetral coalition.

Ligaments

Suboptimally assessed by CT.

Muscles and Tendons

The flexor tendons are unremarkable. There is no evidence of tendon
entrapment.

Soft tissues

There is adjacent soft tissue swelling.  No large hematoma.
IMPRESSION: Comminuted and mildly displaced intra-articular fracture of the base
of the fourth metacarpal.

Comminuted intra-articular hamate fracture with dorsal displacement.

Mild dorsal subluxation of the fourth and fifth metacarpals at the
CMC joints.

## 2024-01-10 ENCOUNTER — Other Ambulatory Visit: Payer: Self-pay

## 2024-01-10 ENCOUNTER — Emergency Department (HOSPITAL_BASED_OUTPATIENT_CLINIC_OR_DEPARTMENT_OTHER)
Admission: EM | Admit: 2024-01-10 | Discharge: 2024-01-10 | Disposition: A | Discharge status: Home / Self Care | Attending: Emergency Medicine | Admitting: Emergency Medicine

## 2024-01-10 ENCOUNTER — Emergency Department (HOSPITAL_BASED_OUTPATIENT_CLINIC_OR_DEPARTMENT_OTHER): Admitting: Radiology

## 2024-01-10 ENCOUNTER — Emergency Department

## 2024-01-10 ENCOUNTER — Encounter (HOSPITAL_BASED_OUTPATIENT_CLINIC_OR_DEPARTMENT_OTHER): Payer: Self-pay | Admitting: Emergency Medicine

## 2024-01-10 ENCOUNTER — Other Ambulatory Visit: Payer: Self-pay | Admitting: Cardiology

## 2024-01-10 DIAGNOSIS — I10 Essential (primary) hypertension: Secondary | ICD-10-CM | POA: Diagnosis not present

## 2024-01-10 DIAGNOSIS — Z720 Tobacco use: Secondary | ICD-10-CM | POA: Diagnosis not present

## 2024-01-10 DIAGNOSIS — R0789 Other chest pain: Secondary | ICD-10-CM | POA: Insufficient documentation

## 2024-01-10 DIAGNOSIS — R002 Palpitations: Secondary | ICD-10-CM | POA: Diagnosis not present

## 2024-01-10 DIAGNOSIS — R9431 Abnormal electrocardiogram [ECG] [EKG]: Secondary | ICD-10-CM | POA: Diagnosis not present

## 2024-01-10 DIAGNOSIS — R072 Precordial pain: Secondary | ICD-10-CM | POA: Diagnosis not present

## 2024-01-10 DIAGNOSIS — E876 Hypokalemia: Secondary | ICD-10-CM | POA: Diagnosis not present

## 2024-01-10 DIAGNOSIS — R079 Chest pain, unspecified: Secondary | ICD-10-CM

## 2024-01-10 LAB — TROPONIN T, HIGH SENSITIVITY
Troponin T High Sensitivity: <15 ng/L (ref <19)
Troponin T High Sensitivity: <15 ng/L (ref <19)

## 2024-01-10 LAB — BASIC METABOLIC PANEL WITH GFR
Anion gap: 12 (ref 5–15)
BUN: 11 mg/dL (ref 6–20)
CO2: 23 mmol/L (ref 22–32)
Calcium: 9.7 mg/dL (ref 8.9–10.3)
Chloride: 108 mmol/L (ref 98–111)
Creatinine, Ser: 1.23 mg/dL (ref 0.61–1.24)
GFR, Estimated: >60 mL/min (ref >60)
Glucose, Bld: 138 mg/dL — ABNORMAL HIGH (ref 70–99)
Potassium: 3.4 mmol/L — ABNORMAL LOW (ref 3.5–5.1)
Sodium: 142 mmol/L (ref 135–145)

## 2024-01-10 LAB — CBC
HCT: 44.3 % (ref 39.0–52.0)
Hemoglobin: 14.6 g/dL (ref 13.0–17.0)
MCH: 28.2 pg (ref 26.0–34.0)
MCHC: 33 g/dL (ref 30.0–36.0)
MCV: 85.5 fL (ref 80.0–100.0)
Platelets: 171 10*3/uL (ref 150–400)
RBC: 5.18 MIL/uL (ref 4.22–5.81)
RDW: 13.2 % (ref 11.5–15.5)
WBC: 6.3 10*3/uL (ref 4.0–10.5)
nRBC: 0 % (ref 0.0–0.2)

## 2024-01-10 LAB — D-DIMER, QUANTITATIVE: D-Dimer, Quant: <0.27 ug{FEU}/mL (ref 0.00–0.50)

## 2024-01-10 MED ORDER — SODIUM CHLORIDE 0.9 % IV BOLUS
1000.0000 mL | Freq: Once | INTRAVENOUS | Status: AC
  Administered 2024-01-10: 1000 mL via INTRAVENOUS

## 2024-01-10 MED ORDER — LOSARTAN POTASSIUM 25 MG PO TABS: 25.0000 mg | ORAL_TABLET | Freq: Every day | ORAL | 1 refills | Status: AC

## 2024-01-10 MED ORDER — LOSARTAN POTASSIUM 25 MG PO TABS: 25.0000 mg | ORAL_TABLET | Freq: Once | ORAL | Status: DC

## 2024-01-10 NOTE — Discharge Instructions (Addendum)
 As discussed, your EKG is concerned that you may have some weakness on one of the walls of your heart.  Will recommend close follow-up with cardiology in the outpatient setting as they are expecting you already.  Avoid intense exercise or any activity that you could experience trauma to your chest.  Will send referral for cardiology as well as give you their information.  Please not hesitate to return to emergency department if the worrisome signs and symptoms we discussed become apparent.

## 2024-01-10 NOTE — Progress Notes (Signed)
 ON-CALL CARDIOLOGY 01/10/24  Patient's name: Nathaniel Jordan.   MRN: [id number].    DOB: 1991/03/05 Primary care provider: Patient, No Pcp Per. Primary cardiologist: None  Referring provider: Wonda Simpers, PA (location [facility]) Consulting provider: Madonna Large, DO, [facility] (location [facility])   Interaction regarding this patient's care today: ED provider reached out to discuss his case.  33 year old African-American gentleman who presents to the hospital with chest pain. Based on the history provided chest pain appears to be noncardiac  No active discomfort.   No family history of premature CAD or cardiomyopathy, based on what I was told.  At times has noticed palpitations but not consistent.  EKG performed:  Sinus rhythm, 100 bpm, LVH per voltage criteria, diffuse T WI in inferior lateral leads likely secondary to LVH but ischemia cannot be ruled out, consider apical hypertrophic cardiomyopathy.  PR interval is 136 ms, QRS duration 110 ms, not classic slurring of the QRS complex to suggest WPW but cannot rule it out.  High sensitive troponins negative x 1  Blood pressures are elevated with systolics greater than 140 mmHg  Impression: Precordial pain. Palpitations. Abnormal EKG   Recommendations: Workup is forthcoming.  ED provider reached out to review his EKG.  Recommended echocardiogram and a cardiac monitor as outpatient if patient is going to be discharged otherwise if he is kept for further observation would recommend echo and telemetry.  Check renin Aldo ratio.  Consider starting losartan 25 mg p.o. daily.  Avoid AV nodal blocking agents if possible for now.  Until the workup is not complete would avoid over exertional activities and competitive sports.  Reviewed EKG with EP attending as well.  Will arrange outpatient follow-up.  Telephone encounter total time: 16 minutes   Madonna Large, DO, [facility]  A Division of  [facility] [address], [place], [place] [address]  [place], [place] [address] 01/10/2024 5:11 PM

## 2024-01-10 NOTE — Progress Notes (Unsigned)
 Enrolled patient for a 7 day Zio XT monitor to be mailed to patients home   [place] to read

## 2024-01-10 NOTE — ED Triage Notes (Signed)
 Left chest pain non-traumatic. Seems worse with activity. Sharp pains. Non-radiating. Denies SOB, N/V/D.

## 2024-01-10 NOTE — Progress Notes (Signed)
 Echo and 7d monitor ordered per Dr. Michele

## 2024-01-10 NOTE — ED Provider Notes (Signed)
 [place] EMERGENCY DEPARTMENT AT [facility] Provider Note   CSN: [id number] Arrival date & time: 01/10/24  1442     Patient presents with: Chest Pain   Nathaniel Jordan is a 33 y.o. male.    Chest Pain   33 year old male presents emergency department complaints of chest pain.  States that he had chest pain for the past few weeks.  States the chest pain occurs at random times.  Described as sharp/stabbing.  States it only last for amount of seconds when it happens.  States that it can happen when he is walking, sitting still, eating food with no real rhyme or reason.  Denies any fevers, shortness of breath, abdominal pain, nausea, vomiting.  Currently without pain.  States he did have pain earlier this morning when he was eating breakfast.  Denies any known family history of cardiac complications or known arrhythmias.  No significant pertinent past medical history.  Prior to Admission medications   Not on File    Allergies: Patient has no known allergies.    Review of Systems  Cardiovascular:  Positive for chest pain.  All other systems reviewed and are negative.   Updated Vital Signs There were no vitals taken for this visit.  Physical Exam Vitals and nursing note reviewed.  Constitutional:      General: He is not in acute distress.    Appearance: He is well-developed.  HENT:     Head: Normocephalic and atraumatic.  Eyes:     Conjunctiva/sclera: Conjunctivae normal.  Cardiovascular:     Rate and Rhythm: Normal rate and regular rhythm.     Pulses: Normal pulses.     Heart sounds: No murmur heard. Pulmonary:     Effort: Pulmonary effort is normal. No respiratory distress.     Breath sounds: Normal breath sounds.  Abdominal:     Palpations: Abdomen is soft.     Tenderness: There is no abdominal tenderness.  Musculoskeletal:        General: No swelling.     Cervical back: Neck supple.     Right lower leg: No edema.     Left lower leg: No edema.   Skin:    General: Skin is warm and dry.     Capillary Refill: Capillary refill takes less than 2 seconds.  Neurological:     Mental Status: He is alert.  Psychiatric:        Mood and Affect: Mood normal.     (all labs ordered are listed, but only abnormal results are displayed) Labs Reviewed  BASIC METABOLIC PANEL WITH GFR - Abnormal; Notable for the following components:      Result Value   Potassium 3.4 (*)    Glucose, Bld 138 (*)    All other components within normal limits  CBC  D-DIMER, QUANTITATIVE  TROPONIN T, HIGH SENSITIVITY    EKG: EKG Interpretation Date/Time:  Thursday January 10 2024 14:52:00 EDT Ventricular Rate:  100 PR Interval:  136 QRS Duration:  100 QT Interval:  354 QTC Calculation: 456 R Axis:   81  Text Interpretation: Normal sinus rhythm Ventricular pre-excitation, WPW pattern type B Abnormal ECG No previous ECGs available No old tracing to compare twi in inferior and lateral leads Confirmed by Charlyn Sora [phone]) on 01/10/2024 3:29:04 PM  Radiology: No results found.   Procedures   Medications Ordered in the ED  sodium chloride  0.9 % bolus 1,000 mL (has no administration in time range)    Clinical Course as  of 01/10/24 1817  Thu Jan 10, 2024  1626 Consulted cardiology Dr. 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  If patient still tachycardic and rest of chest pain workup is reassuring, recommends discharge with beginning of 25 mg of losartan  daily with outpatient follow-up with cardiology.  Will reconsult if chest pain workup is abnormal in any way. [CR]    Clinical Course User Index [CR] Silver Wonda LABOR, PA                                 Medical Decision Making Amount and/or Complexity of Data Reviewed Labs: ordered. Radiology: ordered.   This patient presents to the ED for concern of chest pain, this involves an extensive number of treatment options, and is a complaint that carries  with it a high risk of complications and morbidity.  The differential diagnosis includes, PE, pneumothorax, pericarditis/myocarditis/tamponade, aortic dissection, GERD, anxiety, ACS, other   Co morbidities that complicate the patient evaluation  See HPI   Additional history obtained:  Additional history obtained from EMR External records from outside source obtained and reviewed including hospital records   Lab Tests:  I Ordered, and personally interpreted labs.  The pertinent results include: Mild hypokalemia 3.4 otherwise, chest within limits.  No renal dysfunction.  No leukocytosis.  No evidence of anemia.  Platelets within range.  Initial troponin of less than 15 with repeat less than 15.  D-dimer negative.   Imaging Studies ordered:  I ordered imaging studies including chest x-ray I independently visualized and interpreted imaging which showed no acute cardiopulmonary abnormality I agree with the radiologist interpretation   Cardiac Monitoring: / EKG:  The patient was maintained on a cardiac monitor.  I personally viewed and interpreted the cardiac monitored which showed an underlying rhythm of: Sinus rhythm with ventricular preexcitation T wave inversion in inferior and lateral leads.   Consultations Obtained:  ED course  Problem List / ED Course / Critical interventions / Medication management  Chest pain, abnormal EKG Reevaluation of the patient showed that the patient stayed the same I have reviewed the patients home medicines and have made adjustments as needed   Social Determinants of Health:  Cigarette use.  Denies illicit drug use.   Test / Admission - Considered:  Chest pain, abnormal EKG Vitals signs significant for hypertension blood pressure 146/82.  Otherwise within normal range and stable throughout visit. Laboratory/imaging studies significant for: See above 33 year old male presents emergency department complaints of chest pain.  States that he  had chest pain for the past few weeks.  States the chest pain occurs at random times.  Described as sharp/stabbing.  States it only last for amount of seconds when it happens.  States that it can happen when he is walking, sitting still, eating food with no real rhyme or reason.  Denies any fevers, shortness of breath, abdominal pain, nausea, vomiting.  Currently without pain.  States he did have pain earlier this morning when he was eating breakfast.  Denies any known family history of cardiac complications or known arrhythmias.  Denies history of sudden cardiac death.   On exam, lungs clear to oscillation bilaterally.  No abdominal tenderness.  Patient currently without any symptoms.  Workup today reassuring.  Delta negative troponin; low suspicion for ACS.  D-dimer negative; low suspicion for PE.  Patient with chest x-ray imaging without evidence of pneumonia, pneumothorax or other acute cardiopulmonary  abnormality.  Patient's chest pain somewhat nonspecific not worsened with exertion and consumption of food, position specific; unsure of exact etiology.  EKG initially concerning for WPW pattern.  Given findings in the setting of elevated heart rate in 100s, consulted cardiology who evaluated EKG and was concerned more so for apical hypertrophic cardiomyopathy and WPW.  Recommendation was to begin 25 mg of losartan  daily and follow-up outpatient with cardiology for echocardiogram and heart monitoring.  Treatment plan discussed with patient and he acknowledged understanding was agreeable to said plan.  Patient well-appearing, afebrile in no acute distress. Worrisome signs and symptoms were discussed with the patient, and the patient acknowledged understanding to return to the ED if noticed. Patient was stable upon discharge.     Final diagnoses:  None    ED Discharge Orders     None          Silver Wonda LABOR, [place] 01/10/24 1821    Charlyn Sora, MD 01/11/24 [phone]

## 2024-01-14 LAB — ALDOSTERONE + RENIN ACTIVITY W/ RATIO
ALDO / PRA Ratio: 3.2 (ref 0.0–30.0)
Aldosterone: 2.3 ng/dL (ref 0.0–30.0)
PRA LC/MS/MS: 0.723 ng/mL/h (ref 0.167–5.380)

## 2024-01-18 ENCOUNTER — Ambulatory Visit (HOSPITAL_COMMUNITY)
Admission: RE | Admit: 2024-01-18 | Discharge: 2024-01-18 | Disposition: A | Discharge status: Home / Self Care | Source: Ambulatory Visit | Attending: Internal Medicine | Admitting: Internal Medicine

## 2024-01-18 DIAGNOSIS — R9431 Abnormal electrocardiogram [ECG] [EKG]: Secondary | ICD-10-CM | POA: Insufficient documentation

## 2024-01-18 LAB — ECHOCARDIOGRAM COMPLETE
Area-P 1/2: 6.17 cm2
S' Lateral: 3 cm

## 2024-01-18 MED ORDER — PERFLUTREN LIPID MICROSPHERE
1.0000 mL | INTRAVENOUS | Status: AC | PRN
  Administered 2024-01-18: 2 mL via INTRAVENOUS

## 2024-01-20 ENCOUNTER — Ambulatory Visit: Payer: Self-pay | Admitting: Cardiology

## 2024-01-20 DIAGNOSIS — I422 Other hypertrophic cardiomyopathy: Secondary | ICD-10-CM

## 2024-02-05 ENCOUNTER — Ambulatory Visit: Attending: Cardiology | Admitting: Cardiology

## 2024-02-05 ENCOUNTER — Encounter: Payer: Self-pay | Admitting: Cardiology

## 2024-02-05 ENCOUNTER — Ambulatory Visit: Attending: Cardiology

## 2024-02-05 ENCOUNTER — Other Ambulatory Visit: Payer: Self-pay | Admitting: Cardiology

## 2024-02-05 VITALS — BP 134/84 | HR 84 | Resp 16 | Ht 71.0 in | Wt 179.8 lb

## 2024-02-05 DIAGNOSIS — I456 Pre-excitation syndrome: Secondary | ICD-10-CM

## 2024-02-05 DIAGNOSIS — R9431 Abnormal electrocardiogram [ECG] [EKG]: Secondary | ICD-10-CM

## 2024-02-05 DIAGNOSIS — I517 Cardiomegaly: Secondary | ICD-10-CM

## 2024-02-05 DIAGNOSIS — I422 Other hypertrophic cardiomyopathy: Secondary | ICD-10-CM

## 2024-02-05 DIAGNOSIS — R079 Chest pain, unspecified: Secondary | ICD-10-CM

## 2024-02-05 NOTE — Progress Notes (Signed)
 Cardiology Office Note:    NAME:  Nathaniel Jordan    MRN: [id number] DOB:  07-Jan-1991   PCP:  Nathaniel Jordan, Nathaniel Jordan  Former Cardiology Providers: N/A Primary Cardiologist:  Madonna Large, DO, [facility] (established care 02/05/2024) Electrophysiologist:  None   Referring MD: Silver Wonda LABOR, PA  Reason of Consult: Chest pain and cardiac murmur  Chief Complaint  Nathaniel Jordan presents with   Abnormal ECG   Hospitalization Follow-up   New Nathaniel Jordan (Initial Visit)    History of Present Illness:    Nathaniel Jordan is a 33 y.o. African-American male whose past medical history and cardiovascular risk factors includes: Left ventricular hypertrophy concerning for hypertrophic cardiomyopathy. He is being seen today for the evaluation of chest pain and abnormal EKG at the request of Silver Wonda LABOR, PA.  Nathaniel Jordan went to the ER approximately 3 weeks ago due to chest pain. Discomfort intermittent. Self-limited. Better with walking and exercise. Nathaniel worsening factors. Located left-sided. Last episode yesterday while he was stretching.  Nathaniel Jordan did not play sports while growing up.  Nathaniel exertional syncope.  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  Since his ER visit Nathaniel Jordan had an echocardiogram 01/18/2024 which noted severe asymmetrical hypertrophy of the septum findings concerning for HCM.  Peak gradient with Valsalva 8 mmHg, systolic anterior motion of the mitral valve, and Nathaniel mitral regurgitation.  Nathaniel Jordan denies any family history of premature coronary artery disease, sudden cardiac death, cardiomyopathy. Mom: Alive, heart disease Dad: Information not available Oldest sister: Age of 15, alive and well Older brother: Age 36, alive and well Nathaniel Jordan at the age of 35. And younger brother at the age of 56, alive and well  Cardiac monitor results are pending. Cardiac MRI is currently scheduled for  March 18, 2024  Current Medications: Current Meds  Medication Sig   losartan  (COZAAR ) 25 MG tablet Take 1 tablet (25 mg total) by mouth daily.     Allergies:    Nathaniel Jordan has Nathaniel known allergies.   Past Medical History: Past Medical History:  Diagnosis Date   Closed hamate fracture     Past Surgical History: Past Surgical History:  Procedure Laterality Date   CLOSED REDUCTION METACARPAL WITH PERCUTANEOUS PINNING Right 03/23/2021   Procedure: CLOSED REDUCTION HAMATE WITH PERCUTANEOUS PINNING;  Surgeon: Romona Harari, MD;  Location: [facility];  Service: Orthopedics;  Laterality: Right;   Nathaniel PAST SURGERIES      Social History: Social History   Tobacco Use   Smoking status: Some Days    Current packs/day: 0.50    Average packs/day: 0.5 packs/day for 3.0 years (1.5 ttl pk-yrs)    Types: Cigarettes   Smokeless tobacco: Never  Vaping Use   Vaping status: Every Day  Substance Use Topics   Alcohol use: Yes    Comment: Pt stated that he drinks beer on occasion   Drug use: Not Currently    Types: Marijuana    Comment: not since 2020    Family History: History reviewed. Nathaniel pertinent family history.  ROS:   Review of Systems  Cardiovascular:  Negative for chest pain, claudication, irregular heartbeat, leg swelling, near-syncope, orthopnea, palpitations, paroxysmal nocturnal dyspnea and syncope.  Respiratory:  Negative for shortness of breath.   Hematologic/Lymphatic: Negative for bleeding problem.    EKGs/Labs/Other Studies Reviewed:   EKG: EKG Interpretation Date/Time:  Tuesday February 05 2024 09:33:33 EDT Ventricular Rate:  86 PR  Interval:  136 QRS Duration:  100 QT Interval:  384 QTC Calculation: 459 R Axis:   77  Text Interpretation: Normal sinus rhythm Probable Ventricular pre-excitation, WPW pattern type B When compared with ECG of 10-Jan-2024 14:52, Nathaniel significant change was found Confirmed by Michele Richardson [phone]) on 02/05/2024 9:49:17  AM  Echocardiogram: 01/18/2024 1. Severe asymmetric hypertrophy of the septum with otherwise mild concentric LVH. Findings concerning for hypertrophic cardiomyopathy/ Mild intracavitary gradient. Peak velocity 1.36 m/s. Peak gradient 7 mmHg. With Valsalva peak velcoty 1.38 m/s, peak  gradient 8 mmHg. Left ventricular ejection fraction, by estimation, is 65 to 70%. The left ventricle has normal function. The left ventricle has Nathaniel regional wall motion abnormalities. There is mild concentric left ventricular hypertrophy. Left  ventricular diastolic parameters are consistent with Grade II diastolic dysfunction (pseudonormalization). The average left ventricular global longitudinal strain is -10.1 %. The global longitudinal strain is abnormal. 2. Right ventricular systolic function is normal. The right ventricular size is normal. 3. Left atrial size was mildly dilated. 4. Systolic anterior motion of the mitral valve. The mitral valve is normal in structure. Nathaniel evidence of mitral valve regurgitation. Nathaniel evidence of mitral stenosis. 5. The aortic valve is tricuspid. Aortic valve regurgitation is not visualized. Nathaniel aortic stenosis is present. 6. The inferior vena cava is normal in size with greater than 50% respiratory variability, suggesting right atrial pressure of 3 mmHg.   Labs:    Latest Ref Rng & Units 01/10/2024    2:53 PM  CBC  WBC 4.0 - 10.5 K/uL 6.3   Hemoglobin 13.0 - 17.0 g/dL 85.3   Hematocrit 60.9 - 52.0 % 44.3   Platelets 150 - 400 K/uL 171        Latest Ref Rng & Units 01/10/2024    2:53 PM  BMP  Glucose 70 - 99 mg/dL 861   BUN 6 - 20 mg/dL 11   Creatinine 9.38 - 1.24 mg/dL 8.76   Sodium 864 - 854 mmol/L 142   Potassium 3.5 - 5.1 mmol/L 3.4   Chloride 98 - 111 mmol/L 108   CO2 22 - 32 mmol/L 23   Calcium 8.9 - 10.3 mg/dL 9.7       Latest Ref Rng & Units 01/10/2024    2:53 PM  CMP  Glucose 70 - 99 mg/dL 861   BUN 6 - 20 mg/dL 11   Creatinine 9.38 - 1.24 mg/dL 8.76    Sodium 864 - 854 mmol/L 142   Potassium 3.5 - 5.1 mmol/L 3.4   Chloride 98 - 111 mmol/L 108   CO2 22 - 32 mmol/L 23   Calcium 8.9 - 10.3 mg/dL 9.7     Nathaniel results found for: CHOL, HDL, LDLCALC, LDLDIRECT, TRIG, CHOLHDL Nathaniel results for input(s): LIPOA in the last 8760 hours. Nathaniel components found for: NTPROBNP Nathaniel results for input(s): PROBNP in the last 8760 hours. Nathaniel results for input(s): TSH in the last 8760 hours.  Physical Exam:    Today's Vitals   02/05/24 0929  BP: 134/84  Pulse: 84  Resp: 16  SpO2: 98%  Weight: 179 lb 12.8 oz (81.6 kg)  Height: 5' 11 (1.803 m)   Body mass index is 25.08 kg/m. Wt Readings from Last 3 Encounters:  02/05/24 179 lb 12.8 oz (81.6 kg)  03/23/21 149 lb 0.5 oz (67.6 kg)  03/17/21 150 lb (68 kg)    Physical Exam  Constitutional: Nathaniel distress.  hemodynamically stable  Neck: Nathaniel JVD present.  Cardiovascular:  Normal rate, regular rhythm, S1 normal and S2 normal. Exam reveals Nathaniel gallop, Nathaniel S3 and Nathaniel S4.  Murmur heard. Systolic murmur is present. Pulmonary/Chest: Effort normal and breath sounds normal. Nathaniel stridor. He has Nathaniel wheezes. He has Nathaniel rales.  Musculoskeletal:        General: Nathaniel edema.     Cervical back: Neck supple.  Skin: Skin is warm.     Impression & Recommendation(s):  Impression:   ICD-10-CM   1. Nonspecific abnormal electrocardiogram (ECG) (EKG)  R94.31 EKG 12-Lead    EXERCISE TOLERANCE TEST (ETT)    Ambulatory referral to Cardiac Electrophysiology    CANCELED: LONG TERM MONITOR (3-14 DAYS)    2. LVH (left ventricular hypertrophy)  I51.7 EXERCISE TOLERANCE TEST (ETT)    Ambulatory referral to Cardiac Electrophysiology    CANCELED: LONG TERM MONITOR (3-14 DAYS)    3. Hypertrophic cardiomyopathy (HCC)  I42.2 Ambulatory referral to HOCM       Recommendation(s):  Nathaniel Jordan presented to the hospital approximately 3 weeks ago for precordial pain which is predominantly noncardiac.  Given the abnormal EKG  he was initially recommended to have an echocardiogram which notes findings concerning for HCM.  Cardiac monitor was ordered but results pending and cardiac MRI is currently scheduled for September 2025.  He presents today for follow-up and establish care.  Nathaniel active chest pain but his atypical noncardiac discomfort was present yesterday while stretching.  Nathaniel prior history of near-syncope or syncopal events.  Nathaniel family history of premature coronary artery disease, sudden cardiac death, or cardiomyopathy  Will await the results of the cardiac monitor. Recommend exercise treadmill stress test to evaluate ECG response to WPW pattern as well as blood pressure response to exercise. Follow-up with EP in consultation for abnormal EKG/concern for WPW Follow-up with HCM clinic for further evaluation LVH concerning for possible HCM versus other etiology  Until the workup is not complete Nathaniel Jordan is advised not to overexert himself and if he has symptoms Nathaniel near-syncope or syncopal events should go to the ER for further evaluation and management.  I have also advised him to keep himself well-hydrated and to avoid hydration if possible.  I would like to see him back in the office once the workup is complete and he has been evaluated by both EP and HCM clinic.  Orders Placed:  Orders Placed This Encounter  Procedures   Ambulatory referral to Cardiac Electrophysiology    Referral Priority:   Routine    Referral Type:   Consultation    Referral Reason:   Specialty Services Required    Requested Specialty:   Cardiology    Number of Visits Requested:   1   Ambulatory referral to HOCM    Referral Priority:   Routine    Referral Type:   Consultation    Referral Reason:   Specialty Services Required    Number of Visits Requested:   1   EXERCISE TOLERANCE TEST (ETT)    Standing Status:   Future    Expected Date:   02/12/2024    Expiration Date:   02/04/2025    Where should this test [facility] performed[facility]    Stress with pharmacologic or treadmill ?:   Treadmill w/ exercise    Is Nathaniel Jordan able to ambulate on a treadmill?:   Yes   EKG 12-Lead     Final Medication List:   Nathaniel orders of the defined types were placed in this encounter.  There are Nathaniel discontinued medications.   Current Outpatient Medications:    losartan  (COZAAR ) 25 MG tablet, Take 1 tablet (25 mg total) by mouth daily., Disp: 30 tablet, Rfl: 1  Consent:   Informed Consent   Shared Decision Making/Informed Consent The risks [chest pain, shortness of breath, cardiac arrhythmias, dizziness, blood pressure fluctuations, myocardial infarction, stroke/transient ischemic attack, and life-threatening complications (estimated to be 1 in 10,000)], benefits (risk stratification, diagnosing coronary artery disease, treatment guidance) and alternatives of an exercise tolerance test were discussed in detail with Nathaniel Jordan and he agrees to proceed.     Disposition:   3 months  Nathaniel Jordan may be asked to follow-up sooner based on the results of the above-mentioned testing.  His questions and concerns were addressed to his satisfaction. He voices understanding of the recommendations provided during this encounter.    Signed, Madonna Michele HAS, [facility]  A Division of [facility] [address] [place], [place] [address]  [place], [place] [id number] 02/06/2024 2:30 PM

## 2024-02-05 NOTE — Patient Instructions (Addendum)
 Medication Instructions:  No medication changes were made at this visit. Continue current regimen.   *If you need a refill on your cardiac medications before your next appointment, please call your pharmacy*  Lab Work: None ordered today. If you have labs (blood work) drawn today and your tests are completely normal, you will receive your results only by: MyChart Message (if you have MyChart) OR A paper copy in the mail If you have any lab test that is abnormal or we need to change your treatment, we will call you to review the results.  Testing/Procedures: Your physician has requested that you wear a Zio heart monitor for 14 days. This will be mailed to your home with instructions on how to apply the monitor and how to return it when finished. Please allow 2 weeks after returning the heart monitor before our office calls you with the results.   Your physician has requested that you have an exercise tolerance test. For further information please visit [email or web address] Please also follow instruction sheet, as given.  You already have a Cardiac MRI scheduled for September 2025. Dr. Michele would like for us  to reach out to scheduling to see if we can get this moved up to August 2025. Our office will send a message to the scheduling team and be in touch!  Follow-Up: At [facility], you and your health needs are our priority.  As part of our continuing mission to provide you with exceptional heart care, our providers are all part of one team.  This team includes your primary Cardiologist (physician) and Advanced Practice Providers or APPs (Physician Assistants and Nurse Practitioners) who all work together to provide you with the care you need, when you need it.  Your next appointment:   3 month(s)  Provider:   Madonna Michele, DO    We recommend signing up for the patient portal called MyChart.  Sign up information is provided on this After Visit Summary.  MyChart is used to  connect with patients for Virtual Visits (Telemedicine).  Patients are able to view lab/test results, encounter notes, upcoming appointments, etc.  Non-urgent messages can be sent to your provider as well.   To learn more about what you can do with MyChart, go to [email or web address].   Other Instructions You have been referred to our Electrophysiology team for evaluation of abnormal EKG.  You have been referred to Dr. Santo for our Hypertrophic Cardiomyopathy Clinic. --------------------------------------------------------- Exercise Stress Test   Please arrive 15 minutes prior to your appointment time to allow for registration and insurance purposes.  The test will take approximately 45 minutes to complete.  How to prepare for your Exercise Stress Test: - Do bring a list of your current medications with you. If you do not take any of the medications listed below, you may take your medications as normal the day of the test. - Do NOT take ___ for 24 hours prior to the test. Bring this medication to your appointment as you may be required to take it once the test is complete. - DO wear comfortable clothes (no dresses or overalls) and walking shoes, tennis shoes preferred (no heels or open toed shoes allowed). - Please report to [address], [place], [place] [address] ~ 2nd floor for your test    If these instructions are not followed as listed above, your test will be rescheduled at a later date.  If you can not keep you appointment, please provide 24 hours notification to the Stress Lab  to avoid a possible $50 charge to your patient account.  ------------------------------------------------------- ZIO XT- Long Term Monitor Instructions  Your physician has requested you wear a ZIO patch monitor for 14 days.   This is a single patch monitor. [employer] supplies one patch monitor per enrollment. Additional  stickers are not available. Please do not apply patch if you will be having a  Nuclear Stress Test,  Echocardiogram, Cardiac CT, MRI, or Chest Xray during the period you would be wearing the  monitor. The patch cannot be worn during these tests. You cannot remove and re-apply the  ZIO XT patch monitor.   Your ZIO patch monitor will be mailed 3 day USPS to your address on file. It may take 3-5 days  to receive your monitor after you have been enrolled.   Once you have received your monitor, please review the enclosed instructions. Your monitor  has already been registered assigning a specific monitor serial # to you.     Billing and Patient Assistance Program Information  We have supplied [employer] with any of your insurance information on file for billing purposes.  [employer] offers a sliding scale Patient Assistance Program for patients that do not have  insurance, or whose insurance does not completely cover the cost of the ZIO monitor.  You must apply for the Patient Assistance Program to qualify for this discounted rate.   To apply, please call [employer] at [phone], select option 4, select option 2, ask to apply for  Patient Assistance Program. Meredeth will ask your household income, and how many people  are in your household. They will quote your out-of-pocket cost based on that information.  [employer] will also be able to set up a 53-month, interest-free payment plan if needed.     Applying the monitor  Shave hair from upper left chest.  Hold abrader disc by orange tab. Rub abrader in 40 strokes over the upper left chest as  indicated in your monitor instructions.  Clean area with 4 enclosed alcohol pads. Let dry.  Apply patch as indicated in monitor instructions. Patch will be placed under collarbone on left  side of chest with arrow pointing upward.  Rub patch adhesive wings for 2 minutes. Remove white label marked 1. Remove the white  label marked 2. Rub patch adhesive wings for 2 additional minutes.  While looking in a mirror, press and release button  in center of patch. A small green light will  flash 3-4 times. This will be your only indicator that the monitor has been turned on.  Do not shower for the first 24 hours. You may shower after the first 24 hours.  Press the button if you feel a symptom. You will hear a small click. Record Date, Time and  Symptom in the Patient Logbook.  When you are ready to remove the patch, follow instructions on the last 2 pages of Patient  Logbook. Stick patch monitor onto the last page of Patient Logbook.   Place Patient Logbook in the blue and white box. Use locking tab on box and tape box closed  securely. The blue and white box has prepaid postage on it. Please place it in the mailbox as  soon as possible. Your physician should have your test results approximately 7 days after the  monitor has been mailed back to [facility].   Call [facility] Customer Care at [phone] if you have questions regarding  your ZIO XT patch monitor. Call them immediately if you see an orange  light blinking on your  monitor.   If your monitor falls off in less than 4 days, contact our Monitor department at [phone].   If your monitor becomes loose or falls off after 4 days call [employer] at [phone] for  suggestions on securing your monitor.

## 2024-02-05 NOTE — Progress Notes (Unsigned)
 Enrolled for [employer] to mail a ZIO XT long term holter monitor to the patients address on file.

## 2024-02-06 ENCOUNTER — Encounter: Payer: Self-pay | Admitting: Cardiology

## 2024-02-26 ENCOUNTER — Encounter (HOSPITAL_COMMUNITY): Payer: Self-pay | Admitting: *Deleted

## 2024-02-26 ENCOUNTER — Telehealth (HOSPITAL_COMMUNITY): Payer: Self-pay | Admitting: *Deleted

## 2024-02-26 NOTE — Telephone Encounter (Signed)
 Letter with instructions for upcoming stress test sent via USPS.  Argentina Bees, RN

## 2024-02-26 NOTE — Progress Notes (Signed)
 Electrophysiology Office Note:    Date:  02/27/2024   ID:  Nathaniel Jordan, DOB September 07, 1990, MRN [id number]  PCP:  Patient, No Pcp Per   [facility] Providers Cardiologist:  Madonna Large, DO     Referring MD: Large Madonna, DO   History of Present Illness:    Nathaniel Jordan is a 33 y.o. male with a medical history significant for LVH concerning for hypertrophic cardiomyopathy, referred for ventricular preexcitation.       Discussed the use of AI scribe software for clinical note transcription with the patient, who gave verbal consent to proceed.  History of Present Illness Nathaniel Jordan is a 33 year old male who presents for evaluation and management of PVCs and possible hypertrophic cardiomyopathy. He was referred by Dr. Large for evaluation and management of PVCs.  In July, he experienced chest pain, which was evaluated in the ER and determined to be non-cardiac. An EKG showed excessive QRS voltage and ventricular pre-excitation. An echocardiogram revealed severe asymmetrical hypertrophy, suggesting hypertrophic cardiomyopathy. A seven-day monitor showed no significant arrhythmia, and a subsequent echocardiogram confirmed left ventricular hypertrophy.  He experiences palpitations when lying down but has not required hospital visits for rapid heart rate. He has never experienced syncope.  He engages in significant physical activity through physically demanding jobs. He is unaware of any family history of unexpected health problems or sudden death at a young age and lacks information about his father's medical history.         Today, he reports he is doing well and has no complaints  EKGs/Labs/Other Studies Reviewed Today:     Echocardiogram:  TTE January 18, 2024 Severe asymmetric hypertrophy of the septum with otherwise mild concentric LVH.  Mild intracavitary gradient normal LVEF.  Grade 2 diastolic dysfunction.  Left atrium is mildly dilated.  Systolic  anterior motion of the mitral valve is noted.   Monitors:  7 day monitor July 2025-- my interpretation Sinus rhythm, heart rate 54 260 bpm, average 86 bpm Rare ectopy. Patient triggered events correlate with sinus rhythm    Advanced imaging:  CMR ordered Results pending    EKG:   EKG Interpretation Date/Time:  Wednesday February 27 2024 11:05:51 EDT Ventricular Rate:  81 PR Interval:  130 QRS Duration:  102 QT Interval:  404 QTC Calculation: 469 R Axis:   83  Text Interpretation: Normal sinus rhythm Ventricular pre-excitation When compared with ECG of 05-Feb-2024 09:33, No significant change was found Confirmed by Nancey Scotts [phone]) on 02/27/2024 11:29:17 AM     Physical Exam:    VS:  BP 130/80 (BP Location: Left Arm, Patient Position: Sitting, Cuff Size: Large)   Pulse 81   Ht 5' 11 (1.803 m)   Wt 174 lb (78.9 kg)   SpO2 98%   BMI 24.27 kg/m     Wt Readings from Last 3 Encounters:  02/27/24 174 lb (78.9 kg)  02/05/24 179 lb 12.8 oz (81.6 kg)  03/23/21 149 lb 0.5 oz (67.6 kg)     GEN: Well nourished, well developed in no acute distress CARDIAC: RRR, no murmurs, rubs, gallops RESPIRATORY:  Normal work of breathing MUSCULOSKELETAL: no edema    ASSESSMENT & PLAN:     Ventricular preexcitation No history of palpitations or arrhythmia symptoms, no syncope Stress test is scheduled for initial accessory pathway risk stratification Will need to follow-up after stress test to discuss potential need for invasive study  Cardiac hypertrophy Cardiac MRI is ordered Interventricular septum measures  1.7 cm on TTE Will follow-up after MRI No high risk indicators, pending echo    Signed, Eulas FORBES Furbish, MD  02/27/2024 5:33 PM    [facility]

## 2024-02-27 ENCOUNTER — Encounter: Payer: Self-pay | Admitting: Cardiovascular Disease

## 2024-02-27 ENCOUNTER — Ambulatory Visit: Attending: Cardiology | Admitting: Cardiovascular Disease

## 2024-02-27 VITALS — BP 130/80 | HR 81 | Ht 71.0 in | Wt 174.0 lb

## 2024-02-27 DIAGNOSIS — I517 Cardiomegaly: Secondary | ICD-10-CM | POA: Diagnosis not present

## 2024-02-27 DIAGNOSIS — I422 Other hypertrophic cardiomyopathy: Secondary | ICD-10-CM

## 2024-02-27 DIAGNOSIS — I456 Pre-excitation syndrome: Secondary | ICD-10-CM

## 2024-02-27 DIAGNOSIS — R9431 Abnormal electrocardiogram [ECG] [EKG]: Secondary | ICD-10-CM | POA: Diagnosis not present

## 2024-02-27 NOTE — Patient Instructions (Addendum)
 Medication Instructions: Your physician recommends that you continue on your current medications as directed. Please refer to the Current Medication list given to you today.  *If you need a refill on your cardiac medications before your next appointment, please call your pharmacy*  Lab Work: None ordered.  If you have labs (blood work) drawn today and your tests are completely normal, you will receive your results only by: MyChart Message (if you have MyChart) OR A paper copy in the mail If you have any lab test that is abnormal or we need to change your treatment, we will call you to review the results.  Testing/Procedures: None ordered.   Follow-Up: At [facility], you and your health needs are our priority.  As part of our continuing mission to provide you with exceptional heart care, our providers are all part of one team.  This team includes your primary Cardiologist (physician) and Advanced Practice Providers or APPs (Physician Assistants and Nurse Practitioners) who all work together to provide you with the care you need, when you need it.  Your next appointment:   03/28/2024 at 1015am with Dr Nancey

## 2024-03-05 ENCOUNTER — Ambulatory Visit (HOSPITAL_COMMUNITY)
Admission: RE | Admit: 2024-03-05 | Discharge: 2024-03-05 | Disposition: A | Discharge status: Home / Self Care | Source: Ambulatory Visit | Attending: Cardiology | Admitting: Cardiology

## 2024-03-05 DIAGNOSIS — I517 Cardiomegaly: Secondary | ICD-10-CM | POA: Insufficient documentation

## 2024-03-05 DIAGNOSIS — R9431 Abnormal electrocardiogram [ECG] [EKG]: Secondary | ICD-10-CM | POA: Insufficient documentation

## 2024-03-05 LAB — EXERCISE TOLERANCE TEST
Angina Index: 0
Estimated workload: 14.1
Exercise duration (min): 12 min
Exercise duration (sec): 26 s
MPHR: 185 {beats}/min
Peak HR: 187 {beats}/min
Percent HR: 101 %
Rest HR: 86 {beats}/min

## 2024-03-07 ENCOUNTER — Ambulatory Visit: Payer: Self-pay | Admitting: Cardiology

## 2024-03-14 ENCOUNTER — Encounter (HOSPITAL_COMMUNITY): Payer: Self-pay

## 2024-03-16 DIAGNOSIS — R9431 Abnormal electrocardiogram [ECG] [EKG]: Secondary | ICD-10-CM

## 2024-03-18 ENCOUNTER — Other Ambulatory Visit: Payer: Self-pay | Admitting: Cardiology

## 2024-03-18 ENCOUNTER — Ambulatory Visit (HOSPITAL_COMMUNITY)
Admission: RE | Admit: 2024-03-18 | Discharge: 2024-03-18 | Disposition: A | Discharge status: Home / Self Care | Payer: MEDICAID | Source: Ambulatory Visit | Attending: Cardiology | Admitting: Cardiology

## 2024-03-18 DIAGNOSIS — I422 Other hypertrophic cardiomyopathy: Secondary | ICD-10-CM | POA: Insufficient documentation

## 2024-03-18 MED ORDER — GADOBUTROL 1 MMOL/ML IV SOLN
10.0000 mL | Freq: Once | INTRAVENOUS | Status: AC | PRN
  Administered 2024-03-18: 10 mL via INTRAVENOUS

## 2024-03-25 ENCOUNTER — Ambulatory Visit: Payer: Self-pay | Admitting: Cardiology

## 2024-03-25 DIAGNOSIS — I422 Other hypertrophic cardiomyopathy: Secondary | ICD-10-CM

## 2024-03-26 ENCOUNTER — Other Ambulatory Visit: Payer: Self-pay

## 2024-03-26 DIAGNOSIS — I456 Pre-excitation syndrome: Secondary | ICD-10-CM

## 2024-03-26 NOTE — Progress Notes (Signed)
 Can we get this young man in to see me and Dr. Fairy?  If needed, I would have him see Dr. Fairy first because I really want to get him tested for PRKAG2/WPW gene eval as part of his cardiac risk stratification.  I think it would be impossible to get done before 9/19, but would love to start the process.   Thanks,  MAC

## 2024-03-28 ENCOUNTER — Ambulatory Visit: Payer: Self-pay | Attending: Cardiovascular Disease | Admitting: Cardiovascular Disease

## 2024-03-28 ENCOUNTER — Encounter: Payer: Self-pay | Admitting: Cardiovascular Disease

## 2024-03-28 VITALS — BP 148/94 | HR 80 | Ht 71.0 in | Wt 171.0 lb

## 2024-03-28 DIAGNOSIS — I456 Pre-excitation syndrome: Secondary | ICD-10-CM

## 2024-03-28 DIAGNOSIS — Z01812 Encounter for preprocedural laboratory examination: Secondary | ICD-10-CM

## 2024-03-28 DIAGNOSIS — R002 Palpitations: Secondary | ICD-10-CM

## 2024-03-28 DIAGNOSIS — I422 Other hypertrophic cardiomyopathy: Secondary | ICD-10-CM

## 2024-03-28 DIAGNOSIS — I517 Cardiomegaly: Secondary | ICD-10-CM

## 2024-03-28 NOTE — Patient Instructions (Signed)
 Medication Instructions:  Your physician recommends that you continue on your current medications as directed. Please refer to the Current Medication list given to you today.  *If you need a refill on your cardiac medications before your next appointment, please call your pharmacy*  Lab Work: None ordered  If you have any lab test that is abnormal or we need to change your treatment, we will call you to review the results.  Testing/Procedures: Your physician has recommended that you have an ablation. Catheter ablation is a medical procedure used to treat some cardiac arrhythmias (irregular heartbeats). During catheter ablation, a long, thin, flexible tube is put into a blood vessel in your groin (upper thigh), or neck. This tube is called an ablation catheter. It is then guided to your heart through the blood vessel. Radio frequency waves destroy small areas of heart tissue where abnormal heartbeats may cause an arrhythmia to start.  You will scheduled for 11/17, arrive at 10:30 am to [facility].  The office will be in touch to review procedure instructions and send via mychart.  Follow-Up: At [facility], you and your health needs are our priority.  As part of our continuing mission to provide you with exceptional heart care, our providers are all part of one team.  This team includes your primary Cardiologist (physician) and Advanced Practice Providers or APPs (Physician Assistants and Nurse Practitioners) who all work together to provide you with the care you need, when you need it.  Your next appointment:   1 month(s)  Provider:   You may see Dr. Nancey or one of the following Advanced Practice Providers on your designated Care Team:   Charlies Arthur, PA-C Michael Andy Tillery, PA-C Brandi Ollis, NP Artist Pouch, PA-C    Thank you for choosing [facility]!!   [phone]   Other Instructions  Cardiac Ablation Cardiac ablation is a procedure to destroy,  or ablate, a small amount of heart tissue that is causing problems. The heart has many electrical connections. Sometimes, these connections are abnormal and can cause the heart to beat very fast or irregularly. Ablating the abnormal areas can improve the heart's rhythm or return it to normal. Ablation may be done for people who: Have irregular or rapid heartbeats (arrhythmias). Have Wolff-Parkinson-White syndrome. Have taken medicines for an arrhythmia that did not work or caused side effects. Have a high-risk heartbeat that may be life-threatening. Tell a health care provider about: Any allergies you have. All medicines you are taking, including vitamins, herbs, eye drops, creams, and over-the-counter medicines. Any problems you or family members have had with anesthesia. Any bleeding problems you have. Any surgeries you have had. Any medical conditions you have. Whether you are pregnant or may be pregnant. What are the risks? Your health care provider will talk with you about risks. These may include: Infection. Bruising and bleeding. Stroke or blood clots. Damage to nearby structures or organs. Allergic reaction to medicines or dyes. Needing a pacemaker if the heart gets damaged. A pacemaker is a device that helps the heart beat normally. Failure of the procedure. A repeat procedure may be needed. What happens before the procedure? Medicines Ask your health care provider about: Changing or stopping your regular medicines. These include any heart rhythm medicines, diabetes medicines, or blood thinners you take. Taking medicines such as aspirin and ibuprofen . These medicines can thin your blood. Do not take them unless your health care provider tells you to. Taking over-the-counter medicines, vitamins, herbs, and  supplements. General instructions Follow instructions from your health care provider about what you may eat and drink. If you will be going home right after the procedure,  plan to have a responsible adult: Take you home from the hospital or clinic. You will not be allowed to drive. Care for you for the time you are told. Ask your health care provider what steps will be taken to prevent infection. What happens during the procedure?  An IV will be inserted into one of your veins. You may be given: A sedative. This helps you relax. Anesthesia. This will: Numb certain areas of your body. An incision will be made in your neck or your groin. A needle will be inserted through the incision and into a large vein in your neck or groin. The small, thin tube (catheter) will be inserted through the needle and moved to your heart. A type of X-ray (fluoroscopy) will be used to help guide the catheter and provide images of the heart on a monitor. Dye may be injected through the catheter to help your surgeon see the area of the heart that needs treatment. Electrical currents will be sent from the catheter to destroy heart tissue in certain areas. There are three types of energy that may be used to do this: Heat (radiofrequency energy). Laser energy. Extreme cold (cryoablation). When the tissue has been destroyed, the catheter will be removed. Pressure will be held on the insertion area to prevent bleeding. A bandage (dressing) will be placed over the insertion area. The procedure may vary among health care providers and hospitals. What happens after the procedure? Your blood pressure, heart rate and rhythm, breathing rate, and blood oxygen level will be monitored until you leave the hospital or clinic. Your insertion area will be checked for bleeding. You will need to lie still for a few hours. If your groin was used, you will need to keep your leg straight for a few hours after the catheter is removed. This information is not intended to replace advice given to you by your health care provider. Make sure you discuss any questions you have with your health care  provider. Document Revised: 12/13/2021 Document Reviewed: 12/13/2021 Elsevier Patient Education  2024 [employer].

## 2024-03-28 NOTE — Progress Notes (Signed)
 Electrophysiology Office Note:    Date:  03/28/2024   ID:  Nathaniel Jordan, DOB February 22, 1991, MRN [id number]  PCP:  Patient, No Pcp Per   [facility] Providers Cardiologist:  Madonna Large, DO     Referring MD: No ref. provider found   History of Present Illness:    Nathaniel Jordan is a 33 y.o. male with a medical history significant for LVH concerning for hypertrophic cardiomyopathy, referred for ventricular preexcitation.       Discussed the use of AI scribe software for clinical note transcription with the patient, who gave verbal consent to proceed.  History of Present Illness Nathaniel Jordan is a 33 year old male who presents for evaluation and management of PVCs and possible hypertrophic cardiomyopathy. He was referred by Dr. Large for evaluation and management of PVCs.  In July, he experienced chest pain, which was evaluated in the ER and determined to be non-cardiac. An EKG showed excessive QRS voltage and ventricular pre-excitation. An echocardiogram revealed severe asymmetrical hypertrophy, suggesting hypertrophic cardiomyopathy. A seven-day monitor showed no significant arrhythmia, and a subsequent echocardiogram confirmed left ventricular hypertrophy.  He experiences palpitations when lying down but has not required hospital visits for rapid heart rate. He has never experienced syncope.  He engages in significant physical activity through physically demanding jobs. He is unaware of any family history of unexpected health problems or sudden death at a young age and lacks information about his father's medical history.  Since his last visit, he had an MRI with results detailed below.  He was referred to Dr. Arnetha for management of hypertrophic cardiomyopathy.         Today, he reports he is doing well and has no complaints. 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  EKGs/Labs/Other Studies Reviewed Today:     Echocardiogram:  TTE January 18, 2024 Severe asymmetric hypertrophy of the septum with otherwise mild concentric LVH.  Mild intracavitary gradient normal LVEF.  Grade 2 diastolic dysfunction.  Left atrium is mildly dilated.  Systolic anterior motion of the mitral valve is noted.   Monitors:  7 day monitor July 2025-- my interpretation Sinus rhythm, heart rate 54 260 bpm, average 86 bpm Rare ectopy. 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  Patchy LGE, particulate RV insertion site --10% of total myocardial mass.  LVEF 58%.    EKG:   EKG Interpretation Date/Time:  Friday March 28 2024 10:37:20 EDT Ventricular Rate:  78 PR Interval:  140 QRS Duration:  104 QT Interval:  402 QTC Calculation: 458 R Axis:   82  Text Interpretation: Normal sinus rhythm with sinus arrhythmia Ventricular pre-excitation, WPW pattern type B When compared with ECG of 27-Feb-2024 11:05, No significant change was found Confirmed by Nancey Scotts [phone]) on 03/28/2024 10:39:41 AM     Physical Exam:    VS:  BP (!) 148/94 (BP Location: Left Arm, Patient Position: Sitting, Cuff Size: Normal)   Pulse 80   Ht 5' 11 (1.803 m)   Wt 171 lb (77.6 kg)   SpO2 99%   BMI 23.85 kg/m     Wt Readings from Last 3 Encounters:  03/28/24 171 lb (77.6 kg)  02/27/24 174 lb (78.9 kg)  02/05/24 179 lb 12.8 oz (81.6 kg)     GEN: Well nourished, well developed in no acute distress CARDIAC: RRR, no murmurs, rubs, gallops RESPIRATORY:  Normal work of breathing MUSCULOSKELETAL: no edema    ASSESSMENT & PLAN:     Ventricular preexcitation No history of palpitations or arrhythmia symptoms, no syncope Stress test did not show resolution of  preexcitation I recommended EP study for pathway stratification and possible ablation of WPW.  We discussed the indication, rationale, logistics, anticipated benefits, and potential risks of the ablation procedure including but not limited to -- bleed at the groin access site, chest pain, damage to nearby organs such as the diaphragm, lungs, or esophagus, need for a drainage tube or pacemaker, or prolonged hospitalization. I explained that the risk for stroke, heart attack, need for open chest surgery, or even death is very low but not zero. he  expressed understanding and wishes to proceed.   Cardiac hypertrophy Excellent functional capacity Interventricular septum measures 1.7 cm on TTE, 21 mm on MRI Has follow-up with Dr. 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 Arnetha in person  Signed, Eulas FORBES Furbish, MD  03/28/2024 10:44 AM    [facility]

## 2024-03-31 ENCOUNTER — Ambulatory Visit: Payer: Self-pay | Admitting: Genetic Counselor

## 2024-03-31 ENCOUNTER — Ambulatory Visit: Payer: Self-pay | Attending: Internal Medicine | Admitting: Internal Medicine

## 2024-03-31 VITALS — BP 112/70 | HR 85 | Ht 71.0 in | Wt 174.8 lb

## 2024-03-31 DIAGNOSIS — I456 Pre-excitation syndrome: Secondary | ICD-10-CM

## 2024-03-31 DIAGNOSIS — I517 Cardiomegaly: Secondary | ICD-10-CM

## 2024-03-31 DIAGNOSIS — R9431 Abnormal electrocardiogram [ECG] [EKG]: Secondary | ICD-10-CM

## 2024-03-31 NOTE — Progress Notes (Signed)
 Cardiology Office Note:  .    Date:  03/31/2024  ID:  Nathaniel Jordan, DOB [birth year], MRN [id number] PCP: Patient, No Pcp Per  [facility] Providers Cardiologist:  Madonna Large, DO     CC: WPW Evaluation Consulted for the evaluation of WPW with an HCM phenotype at the behest of Dr. Large  History of Present Illness: .    Nathaniel Jordan is a 33 y.o. male is a 33 year old male with Wolff-Parkinson-White syndrome and hypertrophic nonobstructive cardiomyopathy who presents for evaluation of his cardiac condition. He was referred by Dr. Large for evaluation of his cardiac condition.  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 He also has a 10% late gadolinium enhancement (LGE) burden. His symptoms began around May, coinciding with increased stress and personal loss, including the recent death of his mother.  He underwent an EKG and heart monitoring, which showed rare supraventricular tachycardia but no non-sustained ventricular tachycardia or atrial fibrillation. An MRI revealed a thickened heart wall and some scarring. He has been seen by multiple cardiologists and is scheduled for an electrophysiology (EP) study and potential ablation to address the accessory pathway associated with Wolff-Parkinson-White syndrome.  He experiences palpitations, particularly when lying down, and occasional sharp chest pain lasting less than two minutes. No shortness of breath, dizziness, exercise intolerance, or syncope.  Socially, he works seven days a week, which he attributes to increased stress and the onset of his symptoms. He engages in regular walking as part of his job and for exercise, and he does not experience breathlessness during these activities. He has two children, ages 29 and four, and is concerned about the hereditary nature of his  condition.  Family history is significant for his mother having heart problems, but no other family members are known to have similar conditions. He quit smoking four years ago and consumes alcohol occasionally.  Discussed the use of AI scribe software for clinical note transcription with the patient, who gave verbal consent to proceed.  Relevant histories: .  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 He works seven days a week and engages in walking as a form of exercise. He is dealing with family-related stress and has a support system involving his wife and children. ROS: As per HPI.   Studies Reviewed: .     Cardiac Studies & Procedures   ______________________________________________________________________________________________   STRESS TESTS  EXERCISE TOLERANCE TEST (ETT) 03/05/2024  Interpretation Summary   Exercise capacity was excellent. Stage 5 was reached after exercising for 12 min and 26 sec. Maximum HR of 187 bpm. MPHR 101.0%. Peak METS 14.1.   ST depression (I, II, III, aVF, V3, V4, V5 and V6) was noted. The ECG was not diagnostic due to resting ST-T abnormalities.   Prior study not available for comparison.   ST depression (I, II, III, aVF, V3, V4, V5 and V6) was noted.  Test not interpretable for ischemia due to LVH with repolarization abnormalities/diffuse ST depressions at baseline. Test did show excellent exercise capacity without any significant ectopy or drop in blood pressure.   ECHOCARDIOGRAM  ECHOCARDIOGRAM COMPLETE 01/18/2024  Narrative ECHOCARDIOGRAM REPORT    Patient Name:   Nathaniel Jordan Date of Exam: 01/18/2024 Medical Rec #:  [id number]         Height:       71.0 in Accession #:     [id number]        Weight:       149.0 lb Date of Birth:  12-06-1990          BSA:          1.861 m Patient Age:    33 years          BP:           144/84 mmHg Patient Gender: M                 HR:           95 bpm. Exam Location:  [address]  Procedure: 2D Echo, Cardiac Doppler, Color Doppler, Strain Analysis and Intracardiac Opacification Agent (Both Spectral and Color Flow Doppler were utilized during procedure).  Indications:    R94.31 Abnormal EKG  History:        Patient has no prior history of Echocardiogram examinations. Signs/Symptoms:Chest Pain and Palpitations; Risk Factors:Current Smoker.  Sonographer:    Nolon Berg BA, RDCS Referring Phys: Madonna Large  IMPRESSIONS   1. Severe asymmetric hypertrophy of the septum with otherwise mild concentric LVH. Findings concerning for hypertrophic cardiomyopathy/ Mild intracavitary gradient. Peak velocity 1.36 m/s. Peak gradient 7 mmHg. With Valsalva peak velcoty 1.38 m/s, peak gradient 8 mmHg. Left ventricular ejection fraction, by estimation, is 65 to 70%. The left ventricle has normal function. The left ventricle has no regional wall motion abnormalities. There is mild concentric left ventricular hypertrophy. Left ventricular diastolic parameters are consistent with Grade II diastolic dysfunction (pseudonormalization). The average left ventricular global longitudinal strain is -10.1 %. The global longitudinal strain is abnormal. 2. Right ventricular systolic function is normal. The right ventricular size is normal. 3. Left atrial size was mildly dilated. 4. Systolic anterior motion of the mitral valve. The mitral valve is normal in structure. No evidence of mitral valve regurgitation. No evidence of mitral stenosis. 5. The aortic valve is tricuspid. Aortic valve regurgitation is not visualized. No aortic stenosis is present. 6. The inferior vena cava is normal in size with greater than 50% respiratory variability, suggesting  right atrial pressure of 3 mmHg.  FINDINGS Left Ventricle: Severe asymmetric hypertrophy of the septum with otherwise mild concentric LVH. Findings concerning for hypertrophic cardiomyopathy/ Mild intracavitary gradient. Peak velocity 1.36 m/s. Peak gradient 7 mmHg. With Valsalva peak velcoty 1.38 m/s, peak gradient 8 mmHg. Left ventricular ejection fraction, by estimation, is 65 to 70%. The left ventricle has normal function. The left ventricle has no regional wall motion abnormalities. Definity  contrast agent was given IV to delineate the left ventricular endocardial borders. The average left ventricular global longitudinal strain is -10.1 %. Strain was performed and the global longitudinal strain is abnormal. The left ventricular internal cavity size was normal in size. There is mild concentric left ventricular hypertrophy. Left ventricular diastolic parameters are consistent with Grade II diastolic dysfunction (pseudonormalization). Indeterminate filling pressures.  Right Ventricle: The right ventricular size is normal. No increase in right ventricular wall thickness. Right ventricular systolic function is normal.  Left Atrium: Left atrial size was mildly dilated.  Right Atrium: Right atrial size was normal in size.  Pericardium: There is no evidence of pericardial effusion.  Mitral Valve: Systolic  anterior motion of the mitral valve. The mitral valve is normal in structure. No evidence of mitral valve regurgitation. No evidence of mitral valve stenosis.  Tricuspid Valve: The tricuspid valve is normal in structure. Tricuspid valve regurgitation is trivial. No evidence of tricuspid stenosis.  Aortic Valve: The aortic valve is tricuspid. Aortic valve regurgitation is not visualized. No aortic stenosis is present.  Pulmonic Valve: The pulmonic valve was normal in structure. Pulmonic valve regurgitation is not visualized. No evidence of pulmonic stenosis.  Aorta: The aortic root is normal in  size and structure.  Venous: The inferior vena cava is normal in size with greater than 50% respiratory variability, suggesting right atrial pressure of 3 mmHg.  IAS/Shunts: No atrial level shunt detected by color flow Doppler.   LEFT VENTRICLE PLAX 2D LVIDd:         4.60 cm   Diastology LVIDs:         3.00 cm   LV e' medial:    5.98 cm/s LV PW:         1.20 cm   LV E/e' medial:  12.4 LV IVS:        1.70 cm   LV e' lateral:   10.60 cm/s LVOT diam:     1.80 cm   LV E/e' lateral: 7.0 LV SV:         42 LV SV Index:   23        2D Longitudinal Strain LVOT Area:     2.54 cm  2D Strain GLS (A4C):   -12.3 % 2D Strain GLS (A3C):   -9.6 % 2D Strain GLS (A2C):   -8.5 % 2D Strain GLS Avg:     -10.1 %  RIGHT VENTRICLE             IVC RV Basal diam:  2.90 cm     IVC diam: 1.30 cm RV S prime:     13.30 cm/s TAPSE (M-mode): 2.3 cm  LEFT ATRIUM             Index        RIGHT ATRIUM           Index LA diam:        4.50 cm 2.42 cm/m   RA Area:     13.00 cm LA Vol (A2C):   64.6 ml 34.72 ml/m  RA Volume:   32.20 ml  17.30 ml/m LA Vol (A4C):   41.6 ml 22.36 ml/m LA Biplane Vol: 55.8 ml 29.99 ml/m AORTIC VALVE LVOT Vmax:   114.50 cm/s LVOT Vmean:  69.850 cm/s LVOT VTI:    0.166 m  AORTA Ao Root diam: 2.90 cm Ao Asc diam:  2.60 cm  MITRAL VALVE MV Area (PHT): 6.17 cm    SHUNTS MV Decel Time: 123 msec    Systemic VTI:  0.17 m MV E velocity: 73.90 cm/s  Systemic Diam: 1.80 cm MV A velocity: 32.50 cm/s MV E/A ratio:  2.27  Annabella Scarce MD Electronically signed by Annabella Scarce MD Signature Date/Time: 01/18/2024/11:31:05 PM    Final    MONITORS  LONG TERM MONITOR (3-14 DAYS) 02/08/2024  Narrative Cardiac monitor January 22, 2024 -January 29, 2024 Baseline rhythm: Sinus with ST depression. HR 54-160 bpm, average 86 bpm. Rare supraventricular and ventricular ectopy. 1 rare, auto-triggered episode of PSVT, 5 beats, 2 second duration, on 01/29/24, at 4:22 pm, avg HR 144 bpm,  max HR 152 bpm No sustained arrhythmias. No atrial fibrillation. Patient triggered events:  31 Underlying rhythm sinus with rare PACs.  No sustained arrhythmia.     CARDIAC MRI  MR CARDIAC MORPHOLOGY W WO CONTRAST 03/18/2024  Narrative CLINICAL DATA:  Evaluate for HCM  EXAM: CARDIAC MRI  TECHNIQUE: The patient was scanned on a 1.5 Tesla Siemens magnet. A dedicated cardiac coil was used. Functional imaging was done using Fiesta sequences. 2,3, and 4 chamber views were done to assess for RWMA's. Modified Simpson's rule using a short axis stack was used to calculate an ejection fraction on a dedicated work [employer]. The patient received 10 cc of Gadavist . After 10 minutes inversion recovery sequences were used to assess for infiltration and scar tissue. Phase contrast velocity mapping was performed above the aortic and pulmonic valves  CONTRAST:  10 cc  of Gadavist   FINDINGS: Left ventricle:  -Severe asymmetric hypertrophy measuring up to 21mm in septum (10mm in posterior wall), meeting criteria for hypertrophic cardiomyopathy  -Normal size  -Normal systolic function  -Elevated ECV (31%)  -Normal T2 values  -Patchy LGE, primarily at RV insertion site. LGE accounts for 10% of total myocardial mass  LV EF: 58% (Normal 49-79%)  Absolute volumes:  LV EDV: (Normal 95-215 mL)  LV ESV: 58mL (Normal 25-85 mL)  LV SV: 79mL (Normal 61-145 mL)  CO: 5.8L/min (Normal 3.4-7.8 L/min)  Indexed volumes:  LV EDV: 34mL/sq-m (Normal 50-108 mL/sq-m)  LV ESV: 11mL/sq-m (Normal 11-47 mL/sq-m)  LV SV: 26mL/sq-m (Normal 33-72 mL/sq-m)  CI: 2.9L/min/sq-m (Normal 1.8-4.2 L/min/sq-m)  Right ventricle: Normal size and systolic function  RV EF:  61% (Normal 51-80%)  Absolute volumes:  RV EDV: (Normal 109-217 mL)  RV ESV: 46mL (Normal 23-91 mL)  RV SV: 72mL (Normal 71-141 mL)  CO: 5.3L/min (Normal 2.8-8.8 L/min)  Indexed volumes:  RV  EDV: 90mL/sq-m (Normal 58-109 mL/sq-m)  RV ESV: 95mL/sq-m (Normal 12-46 mL/sq-m)  RV SV: 66mL/sq-m (Normal 38-71 mL/sq-m)  CI: 2.7L/min/sq-m (Normal 1.7-4.2 L/min/sq-m)  Left atrium: Mild enlargement  Right atrium: Normal size  Mitral valve: Mild regurgitation  Aortic valve: Trivial regurgitation  Tricuspid valve: Mild regurgitation  Pulmonic valve: No regurgitation  Aorta: Normal proximal ascending aorta  Pericardium: Normal  IMPRESSION: 1. Severe asymmetric LV hypertrophy measuring up to 21mm in septum (10mm in posterior wall). This is consistent with hypertrophic cardiomyopathy, reverse septal curvature subtype  2. Patchy LGE, particularly at RV insertion site, as can be seen in HCM. LGE accounts for 10% of total myocardial mass  3.  Normal LV size and systolic function (EF 58%)  4.  Normal RV size and systolic function (EF 61%)   Electronically Signed By: Lonni Nanas M.D. On: 03/18/2024 22:22   ______________________________________________________________________________________________       Physical Exam:    VS:  BP 112/70   Pulse 85   Ht 5' 11 (1.803 m)   Wt 174 lb 12.8 oz (79.3 kg)   SpO2 98%   BMI 24.38 kg/m    Wt Readings from Last 3 Encounters:  03/31/24 174 lb 12.8 oz (79.3 kg)  03/28/24 171 lb (77.6 kg)  02/27/24 174 lb (78.9 kg)    Gen: no distress   Neck: No JVD Cardiac: No Rubs or Gallops, no Murmur, RRR +2 radial pulses Respiratory: Clear to auscultation bilaterally, normal effort, normal  respiratory rate GI: Soft, nontender, non-distended  MS: No  edema;  moves all extremities Integument: Skin feels warm Neuro:  At time of evaluation, alert and oriented to person/place/time/situation  Psych: Normal affect, patient feels  ok   ASSESSMENT AND PLAN: .    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 Discussed implications of genetic findings for family members, particularly children, and potential need for monitoring at next echo. Emphasized genetic testing's role in assessing risk and guiding management decisions. 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 Discussed life-saving benefits of ICD in preventing sudden cardiac death, remaining inactive unless a life-threatening arrhythmia occurs. Explained risks and benefits, including device visibility and potential painful shocks if activated. Emphasized ICD's role in long-term management and prevention of sudden cardiac events. Estimated risk of sudden cardiac death under 3.7% over five years, with increased concern due to myocardial fibrosis and small aneurysm. - Recommended primary prevention ICD placement after EP study and ablation. - Provided educational materials on ICDs and discuss potential lifestyle adjustments. 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   Time Spent Directly with Patient:   I have spent a total of 60 minutes with the patient reviewing notes, imaging, EKGs, labs, and examining the patient as well as establishing an assessment and plan that was discussed personally with the patient. Discussed disease state education , using shared decision making tools and cardiac modeling , and SCD risk. Reviewed care and plan in collaboration with clinical genetics (add on)  One year f/u with me; repeat echo with contrast at that time    Stanly Leavens, MD FASE [facility] Cardiologist [facility]  [address] [place], [address] [place], [place] [address] [phone]  10:34 AM

## 2024-03-31 NOTE — Patient Instructions (Signed)
 Medication Instructions:  Your physician recommends that you continue on your current medications as directed. Please refer to the Current Medication list given to you today.  *If you need a refill on your cardiac medications before your next appointment, please call your pharmacy*  Lab Work: NONE  If you have labs (blood work) drawn today and your tests are completely normal, you will receive your results only by: MyChart Message (if you have MyChart) OR A paper copy in the mail If you have any lab test that is abnormal or we need to change your treatment, we will call you to review the results.  Testing/Procedures: NONE  Follow-Up: At [facility], you and your health needs are our priority.  As part of our continuing mission to provide you with exceptional heart care, our providers are all part of one team.  This team includes your primary Cardiologist (physician) and Advanced Practice Providers or APPs (Physician Assistants and Nurse Practitioners) who all work together to provide you with the care you need, when you need it.  Your next appointment:   1 year(s)  Provider:   Stanly Leavens, MD    Other Instructions

## 2024-04-03 ENCOUNTER — Telehealth: Payer: Self-pay | Admitting: Licensed Clinical Social Worker

## 2024-04-03 NOTE — Progress Notes (Signed)
 Heart and Vascular Care Navigation  04/03/2024  Nathaniel Jordan February 19, 1991 [id number]  Reason for Referral: self pay, no PCP.   Engaged with patient by telephone for initial visit for Heart and Vascular Care Coordination.                                                                                                   Assessment:               LCSW was able to reach pt today at [phone]. Introduced self, role, reason for call. Pt confirmed home address, no insurance and no PCP. Resides with minor children. Working on re-establishing employment with the city, hopefully with benefits. At this time he denies any issues with affording food, transportation, medication or housing/utilties. He is aware his recent hospital account has been assigned to [employer] financial counselor to be screened for Medicaid. Pt agreeable to monitoring calls/emails for any communications from their team. I will try and connect them as well if able. We discussed alternate options such as Haematologist and [employer] if not able to re-establish with insurance through employer. Pt agreeable to this being sent to home address.   No additional questions/concerns at this time.                          HRT/VAS Care Coordination     Patients Home Cardiology Office [facility]   Outpatient Care Team Social Worker   Social Worker Name: Marit Lark, [place], [phone]   Living arrangements for the past 2 months Apartment   Lives with: Minor Children   Patient Current Insurance Coverage Self-Pay   Patient Has Concern With Paying Medical Bills Yes   Patient Concerns With Medical Bills self pay, ongoing medical work up   Medical Bill Referrals: Medicaid;  CAFA   Does Patient Have Prescription Coverage? No   Home Assistive Devices/Equipment None       Social History:                                                                             SDOH Screenings   Food Insecurity: No Food  Insecurity (04/03/2024)  Housing: Low Risk  (04/03/2024)  Transportation Needs: No Transportation Needs (04/03/2024)  Utilities: Not At Risk (04/03/2024)  Financial Resource Strain: Medium Risk (04/03/2024)  Tobacco Use: High Risk (03/28/2024)  Health Literacy: Adequate Health Literacy (04/03/2024)    SDOH Interventions: Financial Resources:  Surveyor, quantity Strain Interventions: Artist (Medicaid and CAFA screenings) DSS for financial assistance and Editor, commissioning for [employer] Program  Food Insecurity:  Food Insecurity Interventions: Intervention Not Indicated  Housing Insecurity:  Housing Interventions: Intervention Not Indicated  Transportation:   Transportation Interventions: Intervention Not Indicated     Other Care Navigation  Interventions:     Provided Pharmacy assistance resources  Doing fine with accessing losartan  at [facility], needs to call for refill as running low   Follow-up plan:   LCSW has mailed the following: my card, PCP list, CAFA application and Marketplace flyer. Will do my best to connect pt with Medicaid screening. Will f/u to ensure pt has received resources and answer any questions that may arise.

## 2024-04-07 ENCOUNTER — Other Ambulatory Visit (HOSPITAL_BASED_OUTPATIENT_CLINIC_OR_DEPARTMENT_OTHER): Payer: Self-pay

## 2024-04-08 NOTE — Progress Notes (Cosign Needed Addendum)
 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 He tells me that in July of this year he began experiencing acute chest pains that lasted 2-3 seconds. As the symptoms did not abate, he went to urgent care in July and was referred to cardiology for heart murmur. He underwent cardiac imaging studies that were suggestive of HCM and demonstrated a WPW pattern on EKG.  Family history  Relation to Proband Pedigree # Current age Heart condition/age of onset Notes  Son IV.2 35 None Echo/EKG to be done  Daughter IV.3 35 None Echo/EKG to be done        Brothers, 2 III.2, III.4 35, 32 None Echo/EKG to be done  Sister III.1 35 None Echo/EKG to be done  Niece IV.1 23 None         Father II.1 35 None Not in touch with other paternal relatives        Mother  Nathaniel Jordan, [birth year] 1968) II.2 Deceased CHF @ 65 Died @ 31- renal failure  Maternal aunts, 2 II.3-II.4 58, 52 None   Maternal uncle II.5 Deceased None Died from gunshot wound at 35  Maternal grandfather I.3 Deceased None Died @ ? of ?  Maternal grandmother I.4 Deceased None Died @ 35- liver cirrhosis   Genetics Nathaniel Jordan was counseled on the genetics of hypertrophic cardiomyopathy (HCM). I explained to the patient that this is an autosomal dominant condition with incomplete penetrance i.e. not all individuals harboring the HCM mutation will present clinically with HCM, and age-related penetrance where clinical presentation of HCM increases with advanced age. Variability in clinical expression is also seen in families with HCM with affected family members presenting clinically at different ages and with symptoms ranging from mild to severe.  Since HCM is an autosomal dominant  condition, first degree-relatives are at a 50% risk of inheriting this condition. They should seek regular surveillance for HCM.  First-degree relatives include his son, daughter, brothers, sister, and father. At this time his niece does not need to undergo screening- they can be screened if they are symptomatic or if their parent is found to have HCM.    Clinical screening of first-degree relatives involves echocardiogram and EKG at regular intervals, frequency is typically determined by age, with children undergoing screening every year until the age of 65 and those over the age of 87 getting screened every 3-5 years until the age of 44. Patient verbalized understanding of this.  Also briefly discussed the inheritance pattern and treatment /management plans for the infiltrative cardiomyopathies that present as HCM phenocopies. About 8-10% of HCM patients can have compound and digenic sarcomeric mutations for HCM  Patient should be aware that genetic testing is a probabilistic test dependent upon age and severity of presentation, presence of risk factors for HCM and importantly family history of HCM or sudden death in first-degree relatives. The potential outcomes of genetic testing and subsequent management of at-risk family members is listed below-  If a mutation is not identified then it is important that he understands that HCM is a genetic condition and can be passed down to his children. All first-degree relatives should undergo regular screening for HCM.  A negative test result can be due to limitations of the genetic test.  There is also the likelihood of identifying a "Variant of unknown significance". This result means that the variant has not been detected in a statistically significant number of HCM patients and/or functional studies have not been performed to verify its pathogenicity. This VUS can be tested in the family to see if it segregates with disease. If a VUS is found, first-degree  relatives should undergo regular clinical screening for HCM, but genetic testing for the VUS is otherwise not warranted.  If a pathogenic variant is reported, then first-degree family members can get tested for this variant. If they test positive, it is likely they will develop HCM. In light of variable expression and incomplete penetrance associated with HCM, it is not possible to predict when they will manifest clinically with HCM. It is recommended that family members that test positive for the familial pathogenic variant pursue clinical screening for HCM. Family members that test negative for the familial mutation need not pursue periodic screening for HCM, but seek care if symptoms develop.   Impression  Nathaniel Jordan was found to have cardiac wall thickness suggestive of HCM at age 33 in the absence of other cardiac loading conditions that can lead to cardiac hypertrophy. There is no family history of HCM or sudden death. It is likely he has a de novo mutation for HCM or has inherited this from one of his parents with evident reduced penetrance.  Genetic testing is recommended to confirm his diagnosis. This test should include the major sarcomeric genes involved in HCM, namely MYBPPC3, MYH7, TNNI3, TNNT2, TPM1, TNNC1, ACTC1, MYL2 and MYL3. It should also include the genes involved in HCM phenocopies as cardiac-predominant forms of these conditions present clinically as HCM. These include genes for Fabry disease (GLA), Danon disease (LAMP2), WPW syndrome (PRKAG2), Familial transthyretin amyloidosis (TTR), phospholamban (PLN) and desmin (DES).  In addition, patient should be aware of protections afforded by the Genetic Information Non-Discrimination Act (GINA). GINA protects a patient from losing their employment or health insurance based on their genotype. However, these protections do not cover life insurance and disability. Explained to the patient that family members that are found to have the familial  genetic mutation will be denied life insurance even if they are asymptomatic and do not exhibit clinical signs of HCM. Patient verbalized understanding and will discuss this with their children and siblings.   Please note that the patient has not been counseled in this visit on other personal, cultural, or ethical issues that he may face due to his heart condition.   Plan After a thorough discussion of the risk and benefits of genetic testing for HCM, Nathaniel Jordan defers genetic testing for now. He would like to revisit this once he has a job. Meanwhile patient will discuss HCM screening guidelines and procuring life insurance with their family.      Nathaniel Jordan, Ph.D, [facility] Clinical Molecular Geneticist

## 2024-04-16 ENCOUNTER — Telehealth: Payer: Self-pay | Admitting: Licensed Clinical Social Worker

## 2024-04-16 NOTE — Telephone Encounter (Signed)
 H&V Care Navigation CSW Progress Note  Clinical Social Worker contacted patient by phone to f/u on patient assistance forms and Medicaid screening. No answer today, left voicemail, will re-attempt again as able.  Patient is participating in a Managed Medicaid Plan:  No, self pay only  SDOH Screenings   Food Insecurity: No Food Insecurity (04/03/2024)  Housing: Low Risk  (04/03/2024)  Transportation Needs: No Transportation Needs (04/03/2024)  Utilities: Not At Risk (04/03/2024)  Financial Resource Strain: Medium Risk (04/03/2024)  Tobacco Use: High Risk (03/28/2024)  Health Literacy: Adequate Health Literacy (04/03/2024)    Marit Lark, MSW, LCSW Clinical Social Worker II [facility] Heart/Vascular Care Navigation  [phone]- work cell phone (preferred)

## 2024-04-18 ENCOUNTER — Telehealth: Payer: Self-pay | Admitting: Licensed Clinical Social Worker

## 2024-04-18 NOTE — Telephone Encounter (Signed)
 H&V Care Navigation CSW Progress Note  Clinical Social Worker contacted patient by phone to f/u on patient assistance forms and Medicaid screening. Reached pt at [phone], he confirmed he has received paperwork. Is at work, will look over it, okay with me f/u next week. Encouraged him to call with any other questions.    Patient is participating in a Managed Medicaid Plan:  No, self pay only  SDOH Screenings   Food Insecurity: No Food Insecurity (04/03/2024)  Housing: Low Risk  (04/03/2024)  Transportation Needs: No Transportation Needs (04/03/2024)  Utilities: Not At Risk (04/03/2024)  Financial Resource Strain: Medium Risk (04/03/2024)  Tobacco Use: High Risk (03/28/2024)  Health Literacy: Adequate Health Literacy (04/03/2024)    Marit Lark, MSW, LCSW Clinical Social Worker II [facility] Heart/Vascular Care Navigation  [phone]- work cell phone (preferred)

## 2024-04-23 ENCOUNTER — Encounter: Payer: Self-pay | Admitting: Cardiology

## 2024-04-23 ENCOUNTER — Ambulatory Visit: Payer: Self-pay | Attending: Cardiovascular Disease | Admitting: Cardiology

## 2024-04-23 VITALS — BP 124/88 | HR 85 | Ht 71.0 in | Wt 178.8 lb

## 2024-04-23 DIAGNOSIS — I422 Other hypertrophic cardiomyopathy: Secondary | ICD-10-CM

## 2024-04-23 DIAGNOSIS — I456 Pre-excitation syndrome: Secondary | ICD-10-CM

## 2024-04-23 DIAGNOSIS — R002 Palpitations: Secondary | ICD-10-CM

## 2024-04-23 NOTE — Progress Notes (Signed)
 Cardiology Office Note:    NAME:  Nathaniel Jordan    MRN: [id number] DOB:  1991/05/10   PCP:  Patient, No Pcp Per  Former Cardiology Providers: N/A Primary Cardiologist:  Madonna Large, DO, [facility] (established care 02/05/2024) Electrophysiologist:  None   Referring MD: No ref. provider found  Reason of Consult: Chest pain and cardiac murmur  Chief Complaint  Patient presents with   Follow-up    HCM    History of Present Illness:    Nathaniel Jordan is a 33 y.o. African-American male whose past medical history and cardiovascular risk factors includes: HCM, WPW syndrome, former smoker (quit~2021).   In July 2025 patient went to the ED for chest pain and palpitation evaluation.  EKG noted left ventricular hypertrophy concerning for possible apical hypertrophic cardiomyopathy and WPW pattern.  He had an echocardiogram on 01/18/2024 which noted severe asymmetrical hypertrophy, predominantly septum concerning for HCM.  His peak gradient with Valsalva was 8 mmHg, systolic anterior motion of the mitral valve, but no significant mitral regurgitation.  Patient underwent cardiac monitor which did not note evidence of nonsustained ventricular tachycardia.  GXT did not show any significant exercise-induced arrhythmia.  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  He also had a 10% late gadolinium enhancement pattern.  He was referred to HCM clinic and was seen by Dr. Arnetha as well as medical geneticist Dr. Fairy Haring.   Patient was also evaluated by cardiac electrophysiologist Dr. Nancey and plans to undergo EP study and possible WPW ablation.  Patient has not had any syncopal events.  Based on his HCM workup they may consider ICD implant in the future.  Patient presents today for follow-up.  Since last office visit patient lost his mother and continues to grieve but states each day is getting better. His  palpitations are less pronounced and 1 present short-lived. No syncope or near syncopal events. Patient wants to hold off on genetic testing at this time. Denies any heart failure symptoms.  Patient denies any family history of premature coronary artery disease, sudden cardiac death, cardiomyopathy. Mom: passed, heart disease Dad: Information not available Oldest sister: Age of 27, alive and well Older brother: Age 48, alive and well Patient at the age of 9. And younger brother at the age of 3, alive and well  Current Medications: No outpatient medications have been marked as taking for the 04/23/24 encounter (Office Visit) with Large Madonna, DO.     Allergies:    Patient has no known allergies.   Past Medical History: Past Medical History:  Diagnosis Date   Closed hamate fracture     Past Surgical History: Past Surgical History:  Procedure Laterality Date   CLOSED REDUCTION METACARPAL WITH PERCUTANEOUS PINNING Right 03/23/2021   Procedure: CLOSED REDUCTION HAMATE WITH PERCUTANEOUS PINNING;  Surgeon: Romona Harari, MD;  Location: [facility];  Service: Orthopedics;  Laterality: Right;   NO PAST SURGERIES      Social History: Social History   Tobacco Use   Smoking status: Some Days    Current packs/day: 0.50    Average packs/day: 0.5 packs/day for 3.0 years (1.5 ttl pk-yrs)    Types: Cigarettes   Smokeless tobacco: Never  Vaping Use   Vaping status: Every Day  Substance Use Topics   Alcohol use: Yes    Comment: Pt stated that he drinks beer on occasion   Drug use: Not Currently    Types: Marijuana  Comment: not since 2020    Family History: No family history on file.  ROS:   Review of Systems  Cardiovascular:  Negative for chest pain, claudication, irregular heartbeat, leg swelling, near-syncope, orthopnea, palpitations, paroxysmal nocturnal dyspnea and syncope.  Respiratory:  Negative for shortness of breath.   Hematologic/Lymphatic:  Negative for bleeding problem.    EKGs/Labs/Other Studies Reviewed:    Echocardiogram: 01/18/2024 1. Severe asymmetric hypertrophy of the septum with otherwise mild concentric LVH. Findings concerning for hypertrophic cardiomyopathy/ Mild intracavitary gradient. Peak velocity 1.36 m/s. Peak gradient 7 mmHg. With Valsalva peak velcoty 1.38 m/s, peak  gradient 8 mmHg. Left ventricular ejection fraction, by estimation, is 65 to 70%. The left ventricle has normal function. The left ventricle has no regional wall motion abnormalities. There is mild concentric left ventricular hypertrophy. Left  ventricular diastolic parameters are consistent with Grade II diastolic dysfunction (pseudonormalization). The average left ventricular global longitudinal strain is -10.1 %. The global longitudinal strain is abnormal. 2. Right ventricular systolic function is normal. The right ventricular size is normal. 3. Left atrial size was mildly dilated. 4. Systolic anterior motion of the mitral valve. The mitral valve is normal in structure. No evidence of mitral valve regurgitation. No evidence of mitral stenosis. 5. The aortic valve is tricuspid. Aortic valve regurgitation is not visualized. No aortic stenosis is present. 6. The inferior vena cava is normal in size with greater than 50% respiratory variability, suggesting right atrial pressure of 3 mmHg.   Cardiac monitor January 22, 2024 -January 29, 2024 Baseline rhythm: Sinus with ST depression.  HR 54-160 bpm, average 86 bpm. Rare supraventricular and ventricular ectopy. 1 rare, auto-triggered episode of PSVT, 5 beats, 2 second duration, on 01/29/24, at 4:22 pm, avg HR 144 bpm, max HR 152 bpm No sustained arrhythmias. No atrial fibrillation. Patient triggered events: 31 Underlying rhythm sinus with rare PACs.  No sustained arrhythmia.  Gxt 02/2024   Exercise capacity was excellent. Stage 5 was reached after exercising for 12 min and 26 sec. Maximum HR of 187  bpm. MPHR 101.0%. Peak METS 14.1.   ST depression (I, II, III, aVF, V3, V4, V5 and V6) was noted. The ECG was not diagnostic due to resting ST-T abnormalities.   Prior study not available for comparison.   ST depression (I, II, III, aVF, V3, V4, V5 and V6) was noted.   Test not interpretable for ischemia due to LVH with repolarization abnormalities/diffuse ST depressions at baseline. Test did show excellent exercise capacity without any significant ectopy or drop in blood pressure.  cMRI 03/18/24 1. Severe asymmetric LV hypertrophy measuring up to 21mm in septum (10mm in posterior wall). This is consistent with hypertrophic cardiomyopathy, reverse septal curvature subtype   2. Patchy LGE, particularly at RV insertion site, as can be seen in HCM. LGE accounts for 10% of total myocardial mass   3.  Normal LV size and systolic function (EF 58%)   4.  Normal RV size and systolic function (EF 61%)   Labs:    Latest Ref Rng & Units 01/10/2024    2:53 PM  CBC  WBC 4.0 - 10.5 K/uL 6.3   Hemoglobin 13.0 - 17.0 g/dL 85.3   Hematocrit 60.9 - 52.0 % 44.3   Platelets 150 - 400 K/uL 171        Latest Ref Rng & Units 01/10/2024    2:53 PM  BMP  Glucose 70 - 99 mg/dL 861   BUN 6 - 20 mg/dL 11  Creatinine 0.61 - 1.24 mg/dL 8.76   Sodium 864 - 854 mmol/L 142   Potassium 3.5 - 5.1 mmol/L 3.4   Chloride 98 - 111 mmol/L 108   CO2 22 - 32 mmol/L 23   Calcium 8.9 - 10.3 mg/dL 9.7       Latest Ref Rng & Units 01/10/2024    2:53 PM  CMP  Glucose 70 - 99 mg/dL 861   BUN 6 - 20 mg/dL 11   Creatinine 9.38 - 1.24 mg/dL 8.76   Sodium 864 - 854 mmol/L 142   Potassium 3.5 - 5.1 mmol/L 3.4   Chloride 98 - 111 mmol/L 108   CO2 22 - 32 mmol/L 23   Calcium 8.9 - 10.3 mg/dL 9.7     No results found for: CHOL, HDL, LDLCALC, LDLDIRECT, TRIG, CHOLHDL No results for input(s): LIPOA in the last 8760 hours. No components found for: NTPROBNP No results for input(s): PROBNP in the last  8760 hours. No results for input(s): TSH in the last 8760 hours.  Physical Exam:    Today's Vitals   04/23/24 0955  BP: 124/88  Pulse: 85  SpO2: 99%  Weight: 178 lb 12.8 oz (81.1 kg)  Height: 5' 11 (1.803 m)   Body mass index is 24.94 kg/m. Wt Readings from Last 3 Encounters:  04/23/24 178 lb 12.8 oz (81.1 kg)  03/31/24 174 lb 12.8 oz (79.3 kg)  03/28/24 171 lb (77.6 kg)    Physical Exam  Constitutional: No distress.  hemodynamically stable  Neck: No JVD present.  Cardiovascular: Normal rate, regular rhythm, S1 normal and S2 normal. Exam reveals no gallop, no S3 and no S4.  Murmur heard. Systolic murmur is present. Pulmonary/Chest: Effort normal and breath sounds normal. No stridor. He has no wheezes. He has no rales.  Musculoskeletal:        General: No edema.     Cervical back: Neck supple.  Skin: Skin is warm.     Impression & Recommendation(s):  Impression:   ICD-10-CM   1. Hypertrophic cardiomyopathy (HCC)  I42.2     2. Wolff-Parkinson-White (WPW) syndrome, type B  I45.6     3. Palpitations  R00.2         Recommendation(s):  Hypertrophic cardiomyopathy (HCC) Nonobstructive physiology. No near-syncope or syncopal events. Severe asymmetric LVH measuring up to 21 mm in the septum, reverse septal curvature subtype LGE accounts for 10% of the total myocardial mass Has established care with HCM clinic, greatly appreciate the efforts of Dr. Arnetha to arrange care (progress note 03/31/2024) reviewed. GXT performed which does not illustrate exercise-induced arrhythmia.  Good functional capacity for age. A cardiac monitor did not illustrate any malignant arrhythmias. Baseline EKG has illustrated WPW pattern type B.  Patient was referred to EP for further evaluation. Patient educated on disease surveillance for first-degree relatives.  Patient has 2 kids.  He verbalizes understanding Recommended that he continues to follow-up with Dr. Arnetha as  directed for long-term follow-up of HCM  Wolff-Parkinson-White (WPW) syndrome, type B Palpitations See Dr. Nancey - last note 03/28/2024 reviewed as part of today's visit. Tentatively planned for EP study 05/26/2024  Orders Placed:  No orders of the defined types were placed in this encounter.    Final Medication List:   No orders of the defined types were placed in this encounter.   There are no discontinued medications.   Current Outpatient Medications:    losartan  (COZAAR ) 25 MG tablet, Take 1 tablet (25 mg total) by  mouth daily. (Patient not taking: Reported on 04/23/2024), Disp: 30 tablet, Rfl: 1  Consent:   NA  Disposition:   6 months  Patient may be asked to follow-up sooner based on the results of the above-mentioned testing.  His questions and concerns were addressed to his satisfaction. He voices understanding of the recommendations provided during this encounter.    Signed, Madonna Michele HAS, [facility]  A Division of [facility] [address], [place], [place] [address]  [place], [place] [address] 04/23/2024 12:14 PM

## 2024-04-23 NOTE — Patient Instructions (Signed)
 Medication Instructions:  Your physician recommends that you continue on your current medications as directed. Please refer to the Current Medication list given to you today.  *If you need a refill on your cardiac medications before your next appointment, please call your pharmacy*  Lab Work: None ordered.  You may go to any [facility] Location for your lab work:  [facility] - [address] ([facility]) - [phone] [address]  [place] - [address]   [place]  - [address]   [place] - [address] - [address] [employer]  - [address] - [address] ([facility]   If you have labs (blood work) drawn today and your tests are completely normal, you will receive your results only by: [employer] (if you have MyChart)  If you have any lab test that is abnormal or we need to change your treatment, we will call you or send a MyChart message to review the results.  Testing/Procedures: None ordered.  Follow-Up: At [facility], you and your health needs are our priority.  As part of our continuing mission to provide you with exceptional heart care, we have created designated Provider Care Teams.  These Care Teams include your primary Cardiologist (physician) and Advanced Practice Providers (APPs -  Physician Assistants and Nurse Practitioners) who all work together to provide you with the care you need, when you need it.  We recommend signing up for the patient portal called MyChart.  Sign up information is provided on this After Visit Summary.  MyChart is used to connect with patients for Virtual Visits (Telemedicine).  Patients are able to view lab/test results, encounter notes, upcoming appointments, etc.  Non-urgent messages can be sent to your provider as well.   To learn more about what you can do with MyChart, go to  [email or web address].    Your next appointment:   6 months  The format for your next appointment:   In Person  Provider:   Madonna Large, DO

## 2024-04-23 NOTE — Progress Notes (Signed)
 H&V Care Navigation CSW Progress Note  Clinical Social Worker met with patient to f/u on assistance applications. Pt provided with my number and number to call [employer] and complete Medicaid screening. Shared we cannot apply for [facility] assistance until pt completes Medicaid screening, states understanding. Will f/u to make sure this is complete/answer any other questions.  Patient is participating in a Managed Medicaid Plan:  No, self pay only  SDOH Screenings   Food Insecurity: No Food Insecurity (04/03/2024)  Housing: Low Risk  (04/03/2024)  Transportation Needs: No Transportation Needs (04/03/2024)  Utilities: Not At Risk (04/03/2024)  Financial Resource Strain: Medium Risk (04/03/2024)  Tobacco Use: High Risk (04/23/2024)  Health Literacy: Adequate Health Literacy (04/03/2024)     Nathaniel Jordan, MSW, LCSW Clinical Social Worker II [facility] Heart/Vascular Care Navigation  [phone]- work cell phone (preferred)

## 2024-05-05 ENCOUNTER — Telehealth (HOSPITAL_COMMUNITY): Payer: Self-pay

## 2024-05-05 ENCOUNTER — Telehealth: Payer: Self-pay

## 2024-05-05 ENCOUNTER — Encounter (HOSPITAL_COMMUNITY): Payer: Self-pay

## 2024-05-05 DIAGNOSIS — I456 Pre-excitation syndrome: Secondary | ICD-10-CM

## 2024-05-05 DIAGNOSIS — Z01812 Encounter for preprocedural laboratory examination: Secondary | ICD-10-CM

## 2024-05-05 NOTE — Telephone Encounter (Signed)
 Attempted to reach patient to discuss upcoming procedure, no answer. Left VM for patient to return call.

## 2024-05-05 NOTE — Telephone Encounter (Signed)
-----   Message from Nurse Sherri P sent at 03/28/2024  5:25 PM EDT ----- Regarding: 11/17  WPW Ablation Precert:  MD: Mealor Type of ablation: SVT Diagnosis: tachycardia CPT code: SVT/WPW (06346) Ablation scheduled (date/time): 11/17 12:30 p  Procedure:  Added to calendar? Yes Orders entered? Yes Letter complete? No, >30 days before procedure Scheduled with cath lab? Yes Any medications to hold? N/a Labs ordered (CBC, BMET, PT/INR if on warfarin): Yes Mapping system: CARTO (lab 4 or 6) CARTO/OPAL rep notified? No Cardiac CT needed? No Dye allergy? No Pre-meds ordered and instructions given? N/a Letter method: MyChart H&P: 9/19 Device: No  Follow-up:  Cassie/Angel, please schedule Routine.

## 2024-05-05 NOTE — Telephone Encounter (Signed)
 Patient returned call to discuss upcoming procedure.     Health status review:  Any new medical conditions, recent signs of acute illness or been started on antibiotics? No Any recent hospitalizations or surgeries? No Any new medications started since pre-op visit? No  Follow all medication instructions prior to procedure or the procedure may be rescheduled:    Essential chronic medications:  No medication should be continued, unless told otherwise. On the morning of your procedure DO NOT take any medication.  Nothing to eat or drink after midnight prior to your procedure.  Pre-procedure testing scheduled: lab work by November 3.  Confirmed patient is scheduled for SVT Ablation on Monday, November 3 with Dr. Dr. Nancey. Instructed patient to arrive at the Main Entrance A at [facility]: [address] [place], [place] [address] and check in at Admitting at 9:30 AM.  Plan to go home the same day, you will only stay overnight if medically necessary. You MUST have a responsible adult to drive you home and MUST be with you the first 24 hours after you arrive home or your procedure could be cancelled.  Informed patient a nurse will call a day before the procedure to confirm arrival time and ensure instructions are followed.  Patient verbalized understanding to information provided and is agreeable to proceed with procedure.   Advised patient to contact RN Navigator at [phone], to inform of any new medications started after call or concerns prior to procedure.

## 2024-05-19 NOTE — Telephone Encounter (Signed)
 Contacted patient regarding pre-procedure lab work still needed. Patient apologized, stating he forgot and will plan to have completed by tomorrow.

## 2024-05-20 LAB — CBC
Hematocrit: 47.7 % (ref 37.5–51.0)
Hemoglobin: 15.5 g/dL (ref 13.0–17.7)
MCH: 28.3 pg (ref 26.6–33.0)
MCHC: 32.5 g/dL (ref 31.5–35.7)
MCV: 87 fL (ref 79–97)
Platelets: 201 x10E3/uL (ref 150–450)
RBC: 5.48 x10E6/uL (ref 4.14–5.80)
RDW: 14.3 % (ref 11.6–15.4)
WBC: 7.5 x10E3/uL (ref 3.4–10.8)

## 2024-05-20 LAB — BASIC METABOLIC PANEL WITH GFR
BUN/Creatinine Ratio: 11 (ref 9–20)
BUN: 13 mg/dL (ref 6–20)
CO2: 26 mmol/L (ref 20–29)
Calcium: 10 mg/dL (ref 8.7–10.2)
Chloride: 100 mmol/L (ref 96–106)
Creatinine, Ser: 1.22 mg/dL (ref 0.76–1.27)
Glucose: 87 mg/dL (ref 70–99)
Potassium: 3.9 mmol/L (ref 3.5–5.2)
Sodium: 140 mmol/L (ref 134–144)
eGFR: 79 mL/min/1.73 (ref >59)

## 2024-05-21 ENCOUNTER — Ambulatory Visit: Payer: Self-pay | Admitting: Cardiovascular Disease

## 2024-05-22 ENCOUNTER — Telehealth: Payer: Self-pay | Admitting: Cardiovascular Disease

## 2024-05-22 NOTE — Telephone Encounter (Signed)
 Pt requesting call back regard rescheduling procedure on 11/17 due to cost. Please advise.

## 2024-05-22 NOTE — Telephone Encounter (Signed)
 I spoke with patient.  He states he saw cost of ablation and is concerned about cost.  He is asking if procedure can be rescheduled for about a month from now.  He is hoping insurance will cover more at that time.  I told patient I would hold off cancelling procedure for Monday until we reviewed with Dr Nancey and then we would call him back about cancelling/rescheduling.

## 2024-05-22 NOTE — Telephone Encounter (Signed)
 I spoke with patient and gave him message from Dr Nancey.  Patient would like to cancel ablation and will call back to reschedule.  Cath lab notified

## 2024-05-26 ENCOUNTER — Ambulatory Visit (HOSPITAL_COMMUNITY): Admission: RE | Admit: 2024-05-26 | Payer: Self-pay | Source: Home / Self Care | Admitting: Cardiovascular Disease

## 2024-05-26 ENCOUNTER — Encounter (HOSPITAL_COMMUNITY): Admission: RE | Payer: Self-pay | Source: Home / Self Care

## 2024-05-26 SURGERY — SVT ABLATION
Anesthesia: General

## 2024-06-24 ENCOUNTER — Other Ambulatory Visit (HOSPITAL_BASED_OUTPATIENT_CLINIC_OR_DEPARTMENT_OTHER): Payer: Self-pay

## 2024-06-24 NOTE — Progress Notes (Deleted)
°  Cardiology Office Note:  .   Date:  06/24/2024  ID:  Nathaniel Jordan, DOB 1991/05/29, MRN [id number] PCP: Patient, No Pcp Per  [facility] Providers Cardiologist:  Nathaniel Large, DO {  History of Present Illness: .   Nathaniel Jordan is a 33 y.o. male w/PMHx of  PVCs HCM Pre-excited EKG  He saw Dr. Nancey 03/28/24, discussed pre-excited EKG, no palpitations, symptoms, and a monitor without significant finding/arrhythmia. Echo/c.MRI c/w HCM, pending eval with Dr. Emilee and genetic testing Advised EP study for pathway stratification/possible ablation, also +/- ICD needs in the future, pending further w/u with Dr. Evy Also mentions able to work a physically demanding job without limitations  Saw Dr. Melonie 03/31/24, LGE 10% and concerns of early apical aneurysm,  Recommended  Genetics, EPS +/- ablation and an ICD for primary prevention  Annual visit with him recommended  Ultimately genetic eval and EPS deferred 2/2 cost   Today's visit was scheduled as his 30 day post ablation visit ROS:   *** symptoms *** precedures    Studies Reviewed: Nathaniel Jordan    EKG done today and reviewed by myself:  ***  TTE January 18, 2024 Severe asymmetric hypertrophy of the septum with otherwise mild concentric LVH.  Mild intracavitary gradient normal LVEF.  Grade 2 diastolic dysfunction.  Left atrium is mildly dilated.  Systolic anterior motion of the mitral valve is noted.   7 day monitor July 2025-- my interpretation Sinus rhythm, heart rate 54 260 bpm, average 86 bpm Rare ectopy. Patient triggered events correlate with sinus rhythm    CMR ordered Severe asymmetric LVH measuring up to 21 mm in septum, 10 mm posterior wall consistent with reverse septal curvature subtype.  Patchy LGE, particulate RV insertion site --10% of total myocardial mass.  LVEF 58%.   Risk Assessment/Calculations:    Physical Exam:   VS:  There were no vitals taken for this visit.    Wt Readings from Last 3 Encounters:  04/23/24 178 lb 12.8 oz (81.1 kg)  03/31/24 174 lb 12.8 oz (79.3 kg)  03/28/24 171 lb (77.6 kg)    GEN: Well nourished, well developed in no acute distress NECK: No JVD; No carotid bruits CARDIAC: ***RRR, no murmurs, rubs, gallops RESPIRATORY:  *** CTA b/l without rales, wheezing or rhonchi  ABDOMEN: Soft, non-tender, non-distended EXTREMITIES: *** No edema; No deformity   ASSESSMENT AND PLAN: .    Pre-excited EKG HCM ***     {Are you ordering a CV Procedure (e.g. stress test, cath, DCCV, TEE, etc)?   Press F2        :[id number]}     Dispo: ***  Signed, Nathaniel Macario Arthur, PA-C

## 2024-06-25 ENCOUNTER — Ambulatory Visit: Payer: MEDICAID | Admitting: Physician Assistant

## 2024-08-05 NOTE — Progress Notes (Unsigned)
 " Electrophysiology Office Note:    Date:  08/05/2024   ID:  Nathaniel Jordan, DOB 10-28-1990, MRN [id number]  PCP:  Patient, No Pcp Per   [facility] Providers Cardiologist:  Madonna Large, DO     Referring MD: No ref. provider found   History of Present Illness:    Nathaniel Jordan is a 34 y.o. male with a medical history significant for LVH concerning for hypertrophic cardiomyopathy, referred for ventricular preexcitation.       Discussed the use of AI scribe software for clinical note transcription with the patient, who gave verbal consent to proceed.  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 He was referred by Dr. Large for evaluation and management of PVCs.  In July, he experienced chest pain, which was evaluated in the ER and determined to be non-cardiac. An EKG showed excessive QRS voltage and ventricular pre-excitation. An echocardiogram revealed severe asymmetrical hypertrophy, suggesting hypertrophic cardiomyopathy. A seven-day monitor showed no significant arrhythmia, and a subsequent echocardiogram confirmed left ventricular hypertrophy.  He experiences palpitations when lying down but has not required hospital visits for rapid heart rate. He has never experienced syncope.  He engages in significant physical activity through physically demanding jobs. He is unaware of any family history of unexpected health problems or sudden death at a young age and lacks information about his father's medical history.  Since his last visit, he had an MRI with results detailed below.  He was referred to Dr. Arnetha for management of hypertrophic cardiomyopathy.  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      Today, he reports he is doing well and has no complaints. His mother, however, is  currently hospitalized and, from his description, receiving comfort measures for end-stage disease which he describes as cirrhosis and fluid around her heart.  EKGs/Labs/Other Studies Reviewed Today:     Echocardiogram:  TTE January 18, 2024 Severe asymmetric hypertrophy of the septum with otherwise mild concentric LVH.  Mild intracavitary gradient normal LVEF.  Grade 2 diastolic dysfunction.  Left atrium is mildly dilated.  Systolic anterior motion of the mitral valve is noted.   Monitors:  7 day monitor July 2025-- my interpretation Sinus rhythm, heart rate 54 260 bpm, average 86 bpm Rare ectopy. 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  Patchy LGE, particulate RV insertion site --10% of total myocardial mass.  LVEF 58%.    EKG:         Physical Exam:    VS:  There were no vitals taken for this visit.    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  We discussed the indication,  rationale, logistics, anticipated benefits, and potential risks of the ablation procedure including but not limited to -- bleed at the groin access site, chest pain, damage to nearby organs such as the diaphragm, lungs, or esophagus, need for a drainage tube or pacemaker, or prolonged hospitalization. I explained that the risk  for stroke, heart attack, need for open chest surgery, or even death is very low but not zero. he  expressed understanding and wishes to proceed.   Cardiac hypertrophy Excellent functional capacity Interventricular septum measures 1.7 cm on TTE, 21 mm on MRI Has follow-up with Dr. 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 Arnetha in person  Signed, Eulas FORBES Furbish, MD  08/05/2024 2:13 PM    [facility] "

## 2024-08-06 ENCOUNTER — Ambulatory Visit: Payer: MEDICAID | Admitting: Cardiovascular Disease
